# Patient Record
Sex: Female | Born: 1960 | Race: Black or African American | Hispanic: No | Marital: Single | Smoking: Never smoker
Health system: Southern US, Community
[De-identification: ages and names within clinical notes are randomized; demographics above are authoritative.]

## PROBLEM LIST (undated history)

## (undated) DIAGNOSIS — G629 Polyneuropathy, unspecified: Secondary | ICD-10-CM

## (undated) DIAGNOSIS — I10 Essential (primary) hypertension: Secondary | ICD-10-CM

## (undated) DIAGNOSIS — M419 Scoliosis, unspecified: Secondary | ICD-10-CM

## (undated) HISTORY — PX: CHOLECYSTECTOMY: SHX55

## (undated) HISTORY — PX: BACK SURGERY: SHX140

---

## 2012-03-25 ENCOUNTER — Emergency Department (HOSPITAL_BASED_OUTPATIENT_CLINIC_OR_DEPARTMENT_OTHER)
Admission: EM | Admit: 2012-03-25 | Discharge: 2012-03-25 | Disposition: A | Discharge status: Home / Self Care | Payer: Medicaid Other | Attending: Emergency Medicine | Admitting: Emergency Medicine

## 2012-03-25 ENCOUNTER — Encounter (HOSPITAL_BASED_OUTPATIENT_CLINIC_OR_DEPARTMENT_OTHER): Payer: Self-pay | Admitting: Emergency Medicine

## 2012-03-25 DIAGNOSIS — H109 Unspecified conjunctivitis: Secondary | ICD-10-CM | POA: Insufficient documentation

## 2012-03-25 DIAGNOSIS — G609 Hereditary and idiopathic neuropathy, unspecified: Secondary | ICD-10-CM | POA: Insufficient documentation

## 2012-03-25 DIAGNOSIS — I10 Essential (primary) hypertension: Secondary | ICD-10-CM | POA: Insufficient documentation

## 2012-03-25 HISTORY — DX: Scoliosis, unspecified: M41.9

## 2012-03-25 HISTORY — DX: Essential (primary) hypertension: I10

## 2012-03-25 HISTORY — DX: Polyneuropathy, unspecified: G62.9

## 2012-03-25 MED ORDER — BACITRA-NEOMYCIN-POLYMYXIN-HC 1 % OP OINT: TOPICAL_OINTMENT | Freq: Three times a day (TID) | OPHTHALMIC | Status: AC

## 2012-03-25 NOTE — ED Notes (Signed)
Eye burning and stinging started 4 days ago.

## 2012-03-25 NOTE — ED Provider Notes (Signed)
History     CSN: [id number]  Arrival date & time 03/25/12  2124   First MD Initiated Contact with Patient 03/25/12 2158      Chief Complaint  Patient presents with  . Eye Pain    (Consider location/radiation/quality/duration/timing/severity/associated sxs/prior treatment) HPI Comments: No injury or exposures.  Had similar complaints that were treated with drops several months ago.  Not a contact lens wearer.  Patient is a 51 y.o. female presenting with eye pain. The history is provided by the patient.  Eye Pain This is a new problem. Episode onset: 4 days ago. The problem occurs constantly. The problem has been gradually worsening. The symptoms are aggravated by nothing. The symptoms are relieved by nothing. She has tried nothing for the symptoms.    Past Medical History  Diagnosis Date  . Hypertension   . Peripheral neuropathy   . Scoliosis     Past Surgical History  Procedure Date  . Back surgery   . Cholecystectomy     No family history on file.  History  Substance Use Topics  . Smoking status: Never Smoker   . Smokeless tobacco: Never Used  . Alcohol Use: 21.0 oz/week    15 Shots of liquor, 20 Cans of beer per week    OB History    Grav Para Term Preterm Abortions TAB SAB Ect Mult Living                  Review of Systems  Eyes: Positive for pain.  All other systems reviewed and are negative.    Allergies  Review of patient's allergies indicates no known allergies.  Home Medications  No current outpatient prescriptions on file.  BP 136/100  Pulse 79  Temp(Src) 97.5 F (36.4 C) (Oral)  Resp 16  Ht 5\' 6"  (1.676 m)  Wt 135 lb (61.236 kg)  BMI 21.79 kg/m2  SpO2 100%  LMP 03/25/2008  Physical Exam  Nursing note and vitals reviewed. Constitutional: She is oriented to person, place, and time. She appears well-developed and well-nourished.  HENT:  Head: Normocephalic and atraumatic.  Eyes:       The eyes are noted to be essentially normal in  appearance.  There is mild conjunctival injection present.  The corneas are clear and pupils are reactive.  Neck: Normal range of motion. Neck supple.  Neurological: She is alert and oriented to person, place, and time.    ED Course  Procedures (including critical care time)  Labs Reviewed - No data to display No results found.   No diagnosis found.    MDM  Appears to be conjunctivitis.  Will treat with polymixin drops, return prn.        Geoffery Lyons, MD 03/25/12 [phone]

## 2012-03-25 NOTE — Discharge Instructions (Signed)
Conjunctivitis Conjunctivitis is commonly called "pink eye." Conjunctivitis can be caused by bacterial or viral infection, allergies, or injuries. There is usually redness of the lining of the eye, itching, discomfort, and sometimes discharge. There may be deposits of matter along the eyelids. A viral infection usually causes a watery discharge, while a bacterial infection causes a yellowish, thick discharge. Pink eye is very contagious and spreads by direct contact. You may be given antibiotic eyedrops as part of your treatment. Before using your eye medicine, remove all drainage from the eye by washing gently with warm water and cotton balls. Continue to use the medication until you have awakened 2 mornings in a row without discharge from the eye. Do not rub your eye. This increases the irritation and helps spread infection. Use separate towels from other household members. Wash your hands with soap and water before and after touching your eyes. Use cold compresses to reduce pain and sunglasses to relieve irritation from light. Do not wear contact lenses or wear eye makeup until the infection is gone. SEEK MEDICAL CARE IF:   Your symptoms are not better after 3 days of treatment.   You have increased pain or trouble seeing.   The outer eyelids become very red or swollen.  Document Released: 11/19/2004 Document Revised: 10/01/2011 Document Reviewed: 10/12/2005 Exit[employer] Patient Information 2012 [employer]. 

## 2012-10-01 ENCOUNTER — Emergency Department (HOSPITAL_BASED_OUTPATIENT_CLINIC_OR_DEPARTMENT_OTHER)
Admission: EM | Admit: 2012-10-01 | Discharge: 2012-10-01 | Disposition: A | Discharge status: Home / Self Care | Payer: Medicaid Other | Attending: Emergency Medicine | Admitting: Emergency Medicine

## 2012-10-01 ENCOUNTER — Encounter (HOSPITAL_BASED_OUTPATIENT_CLINIC_OR_DEPARTMENT_OTHER): Payer: Self-pay | Admitting: *Deleted

## 2012-10-01 ENCOUNTER — Emergency Department (HOSPITAL_BASED_OUTPATIENT_CLINIC_OR_DEPARTMENT_OTHER): Payer: Medicaid Other

## 2012-10-01 DIAGNOSIS — M1710 Unilateral primary osteoarthritis, unspecified knee: Secondary | ICD-10-CM | POA: Insufficient documentation

## 2012-10-01 DIAGNOSIS — M4120 Other idiopathic scoliosis, site unspecified: Secondary | ICD-10-CM | POA: Insufficient documentation

## 2012-10-01 DIAGNOSIS — M25469 Effusion, unspecified knee: Secondary | ICD-10-CM | POA: Insufficient documentation

## 2012-10-01 DIAGNOSIS — G609 Hereditary and idiopathic neuropathy, unspecified: Secondary | ICD-10-CM | POA: Insufficient documentation

## 2012-10-01 DIAGNOSIS — M1712 Unilateral primary osteoarthritis, left knee: Secondary | ICD-10-CM

## 2012-10-01 DIAGNOSIS — R269 Unspecified abnormalities of gait and mobility: Secondary | ICD-10-CM | POA: Insufficient documentation

## 2012-10-01 DIAGNOSIS — I10 Essential (primary) hypertension: Secondary | ICD-10-CM | POA: Insufficient documentation

## 2012-10-01 DIAGNOSIS — IMO0002 Reserved for concepts with insufficient information to code with codable children: Secondary | ICD-10-CM | POA: Insufficient documentation

## 2012-10-01 DIAGNOSIS — R609 Edema, unspecified: Secondary | ICD-10-CM | POA: Insufficient documentation

## 2012-10-01 DIAGNOSIS — Z8739 Personal history of other diseases of the musculoskeletal system and connective tissue: Secondary | ICD-10-CM | POA: Insufficient documentation

## 2012-10-01 MED ORDER — HYDROCODONE-ACETAMINOPHEN 5-325 MG PO TABS: 2.0000 | ORAL_TABLET | Freq: Four times a day (QID) | ORAL | Status: DC | PRN

## 2012-10-01 NOTE — ED Provider Notes (Addendum)
History   This chart was scribed for Doug Sou, MD by Leone Payor, ED Scribe. This patient was seen in room MH10/MH10 and the patient's care was started at 1731.   CSN: [id number]  Arrival date & time 10/01/12  1639   First MD Initiated Contact with Patient 10/01/12 1731      Chief Complaint  Patient presents with  . Knee Pain     The history is provided by the patient. No language interpreter was used.     Alice Riley is a 51 y.o. female who presents to the Emergency Department complaining of constant, gradually worsening left knee pain  1 week ago. She denies any known injury to the area and describes the pain as aching "like a bad tooth-ache". Pt reports taking Advil with no relief. She denies having prior pain in the left knee and denies back pain or neck pain. No fever no other joint ache no injury pain is worse with movement improved with remaining still. Nonradiating  Pt denies smoking and alcohol use.   History reviewed. No pertinent past medical history.  History reviewed. No pertinent past surgical history.  History reviewed. No pertinent family history.  History  Substance Use Topics  . Smoking status: Never Smoker   . Smokeless tobacco: Not on file  . Alcohol Use: Yes    No OB history provided.   Review of Systems  Constitutional: Negative.   HENT: Negative.  Negative for neck pain.   Respiratory: Negative.   Cardiovascular: Negative.   Gastrointestinal: Negative.   Musculoskeletal: Positive for arthralgias (left knee). Negative for back pain.       Left knee pain otherwise no arthralgias  Skin: Negative.   Neurological: Negative.   Hematological: Negative.   Psychiatric/Behavioral: Negative.   All other systems reviewed and are negative.    Allergies  Review of patient's allergies indicates no known allergies.  Home Medications  No current outpatient prescriptions on file.  BP 154/109  Pulse 90  Temp 99 F (37.2 C) (Oral)  Resp 20   Ht 5\' 6"  (1.676 m)  Wt 135 lb (61.236 kg)  BMI 21.79 kg/m2  SpO2 99%  LMP 10/01/2009  Physical Exam  Nursing note and vitals reviewed. Constitutional: She appears well-developed and well-nourished.  HENT:  Head: Normocephalic and atraumatic.  Eyes: Conjunctivae normal are normal. Pupils are equal, round, and reactive to light.  Neck: Neck supple. No tracheal deviation present. No thyromegaly present.  Cardiovascular: Normal rate, regular rhythm and normal heart sounds.   No murmur heard. Pulmonary/Chest: Effort normal and breath sounds normal.  Abdominal: Soft. Bowel sounds are normal. She exhibits no distension. There is no tenderness.  Musculoskeletal: Normal range of motion. She exhibits no edema and no tenderness.       Left knee is mildly swollen and tender anteriorly, not hot or red. Hip is normal. Ankle and foot are normal  Neurological: She is alert. Coordination normal.       Walks with a limp favoring left leg  Skin: Skin is warm and dry. No rash noted.  Psychiatric: She has a normal mood and affect.    ED Course  Procedures (including critical care time)  DIAGNOSTIC STUDIES: Oxygen Saturation is 99% on room air, normal by my interpretation.    COORDINATION OF CARE:     Labs Reviewed - No data to display Dg Knee Complete 4 Views Left  10/01/2012  *RADIOLOGY REPORT*  Clinical Data: Knee pain for 1 week  LEFT KNEE -  COMPLETE 4+ VIEW  Comparison: None.  Findings: Four views of the left knee submitted.  No acute fracture or subluxation.  Mild narrowing of medial joint compartment.  Small joint effusion.  Mild spurring of the medial femoral condyle.  IMPRESSION: No acute fracture or subluxation.  Small joint effusion.  Mild degenerative changes.   Original Report Authenticated By: Natasha Mead, M.D.    x-ray reviewed by me   No diagnosis found.    MDM  Knee is minimally swollen. Patient likely with arthritis likely degenerative in etiology. Plan prescription Vicodin,  crutches, orthopedic follow up if not improved 2 days. Blood pressure recheck within 3 weeks. Note narcotic pain medicine not given here as patient is driving home. Diagnosis #1 arthritis right knee #2 elevated blood pressure       I personally performed the services described in this documentation, which was scribed in my presence. The recorded information has been reviewed and is accurate.    Doug Sou, MD 10/01/12 1811  Doug Sou, MD 10/01/12 1816

## 2012-10-01 NOTE — ED Notes (Signed)
Pt states that her left knee has been hurting for about a week. No known injury.

## 2012-10-03 ENCOUNTER — Encounter (HOSPITAL_BASED_OUTPATIENT_CLINIC_OR_DEPARTMENT_OTHER): Payer: Self-pay | Admitting: Emergency Medicine

## 2013-08-14 ENCOUNTER — Emergency Department (HOSPITAL_BASED_OUTPATIENT_CLINIC_OR_DEPARTMENT_OTHER)
Admission: EM | Admit: 2013-08-14 | Discharge: 2013-08-14 | Disposition: A | Discharge status: Home / Self Care | Payer: Medicaid Other | Attending: Emergency Medicine | Admitting: Emergency Medicine

## 2013-08-14 ENCOUNTER — Encounter (HOSPITAL_BASED_OUTPATIENT_CLINIC_OR_DEPARTMENT_OTHER): Payer: Self-pay | Admitting: Emergency Medicine

## 2013-08-14 DIAGNOSIS — Z8669 Personal history of other diseases of the nervous system and sense organs: Secondary | ICD-10-CM | POA: Insufficient documentation

## 2013-08-14 DIAGNOSIS — I10 Essential (primary) hypertension: Secondary | ICD-10-CM | POA: Insufficient documentation

## 2013-08-14 DIAGNOSIS — R112 Nausea with vomiting, unspecified: Secondary | ICD-10-CM | POA: Insufficient documentation

## 2013-08-14 DIAGNOSIS — Z8739 Personal history of other diseases of the musculoskeletal system and connective tissue: Secondary | ICD-10-CM | POA: Insufficient documentation

## 2013-08-14 DIAGNOSIS — N390 Urinary tract infection, site not specified: Secondary | ICD-10-CM | POA: Insufficient documentation

## 2013-08-14 DIAGNOSIS — R42 Dizziness and giddiness: Secondary | ICD-10-CM | POA: Insufficient documentation

## 2013-08-14 DIAGNOSIS — R5381 Other malaise: Secondary | ICD-10-CM | POA: Insufficient documentation

## 2013-08-14 DIAGNOSIS — M79609 Pain in unspecified limb: Secondary | ICD-10-CM | POA: Insufficient documentation

## 2013-08-14 LAB — BASIC METABOLIC PANEL
BUN: 4 mg/dL — ABNORMAL LOW (ref 6–23)
Calcium: 10.1 mg/dL (ref 8.4–10.5)
GFR calc Af Amer: >90 mL/min (ref >90)
GFR calc non Af Amer: >90 mL/min (ref >90)
Glucose, Bld: 104 mg/dL — ABNORMAL HIGH (ref 70–99)
Sodium: 131 mEq/L — ABNORMAL LOW (ref 135–145)

## 2013-08-14 LAB — URINALYSIS, ROUTINE W REFLEX MICROSCOPIC
Nitrite: POSITIVE — AB
Protein, ur: 100 mg/dL — AB
Specific Gravity, Urine: 1.017 (ref 1.005–1.030)
Urobilinogen, UA: 1 mg/dL (ref 0.0–1.0)

## 2013-08-14 LAB — URINE MICROSCOPIC-ADD ON

## 2013-08-14 MED ORDER — SODIUM CHLORIDE 0.9 % IV BOLUS (SEPSIS)
1000.0000 mL | Freq: Once | INTRAVENOUS | Status: AC
  Administered 2013-08-14: 1000 mL via INTRAVENOUS

## 2013-08-14 MED ORDER — FAMOTIDINE IN NACL 20-0.9 MG/50ML-% IV SOLN
20.0000 mg | Freq: Once | INTRAVENOUS | Status: AC
  Administered 2013-08-14: 20 mg via INTRAVENOUS
  Filled 2013-08-14: qty 50

## 2013-08-14 MED ORDER — HYDROCODONE-ACETAMINOPHEN 5-325 MG PO TABS: 1.0000 | ORAL_TABLET | ORAL | Status: DC | PRN

## 2013-08-14 MED ORDER — CEFTRIAXONE SODIUM 1 G IJ SOLR
1.0000 g | Freq: Once | INTRAMUSCULAR | Status: AC
  Administered 2013-08-14: 1 g via INTRAMUSCULAR
  Filled 2013-08-14: qty 10

## 2013-08-14 MED ORDER — LIDOCAINE HCL (PF) 1 % IJ SOLN
INTRAMUSCULAR | Status: AC
  Administered 2013-08-14: 5 mL
  Filled 2013-08-14: qty 5

## 2013-08-14 MED ORDER — ONDANSETRON 8 MG PO TBDP
8.0000 mg | ORAL_TABLET | Freq: Once | ORAL | Status: AC
  Administered 2013-08-14: 8 mg via ORAL
  Filled 2013-08-14: qty 1

## 2013-08-14 MED ORDER — DIPHENHYDRAMINE HCL 50 MG/ML IJ SOLN
25.0000 mg | Freq: Once | INTRAMUSCULAR | Status: AC
  Administered 2013-08-14: 25 mg via INTRAVENOUS
  Filled 2013-08-14: qty 1

## 2013-08-14 MED ORDER — KETOROLAC TROMETHAMINE 30 MG/ML IJ SOLN
30.0000 mg | Freq: Once | INTRAMUSCULAR | Status: AC
  Administered 2013-08-14: 30 mg via INTRAVENOUS
  Filled 2013-08-14: qty 1

## 2013-08-14 MED ORDER — ONDANSETRON HCL 4 MG PO TABS: 4.0000 mg | ORAL_TABLET | Freq: Three times a day (TID) | ORAL | Status: DC | PRN

## 2013-08-14 MED ORDER — CEPHALEXIN 500 MG PO CAPS: 500.0000 mg | ORAL_CAPSULE | Freq: Four times a day (QID) | ORAL | Status: DC

## 2013-08-14 NOTE — ED Notes (Signed)
Pt reports she awakened with left LE pain, dizziness, generalized weakness and a headache this am. Pt appears anxious.

## 2013-08-14 NOTE — ED Provider Notes (Signed)
CSN: [id number]     Arrival date & time 08/14/13  4098 History   First MD Initiated Contact with Patient 08/14/13 1019     Chief Complaint  Patient presents with  . Oral Swelling  . Dizziness  . Leg Pain  . Weakness   (Consider location/radiation/quality/duration/timing/severity/associated sxs/prior Treatment) The history is provided by the patient.    Pt reports she was feeling fatigued yesterday and then overnight developed N/V x 3, emesis was yellow, and headache.  Headache is over the top of her head and her bilateral temples, the pain began as mild and gradually worsened.   She is also having occasional cramping in her bilateral lower extremities that last seconds. Also notes lower lip swelling with itching. Denies itching or swelling within mouth or throat. Denies neck stiffness, CP, SOB, cough, abdominal pain, diarrhea, urinary symptoms, abnormal vaginal discharge or bleeding.   Denies sick contacts, recent travel, new or different foods.  Denies any contact with allergens.  Denies taking any blood pressure medications.   Past Medical History  Diagnosis Date  . Hypertension   . Peripheral neuropathy   . Scoliosis    Past Surgical History  Procedure Laterality Date  . Back surgery    . Cholecystectomy     No family history on file. History  Substance Use Topics  . Smoking status: Never Smoker   . Smokeless tobacco: Not on file  . Alcohol Use: 0.0 oz/week     Comment: last drink 1 week ago   OB History   Grav Para Term Preterm Abortions TAB SAB Ect Mult Living                 Review of Systems  Constitutional: Negative for fever and chills.  Respiratory: Negative for cough and shortness of breath.   Cardiovascular: Negative for chest pain.  Gastrointestinal: Negative for nausea, vomiting, abdominal pain and diarrhea.  Genitourinary: Negative for dysuria, urgency, frequency, vaginal bleeding and vaginal discharge.  Neurological: Positive for headaches.     Allergies  Review of patient's allergies indicates no known allergies.  Home Medications  No current outpatient prescriptions on file. BP 155/96  Pulse 97  Temp(Src) 98.8 F (37.1 C) (Oral)  Resp 20  SpO2 100% Physical Exam  Nursing note and vitals reviewed. Constitutional: She appears well-developed and well-nourished. No distress.  HENT:  Head: Normocephalic and atraumatic.  Neck: Neck supple.  Cardiovascular: Normal rate and regular rhythm.   Pulmonary/Chest: Effort normal and breath sounds normal. No respiratory distress. She has no wheezes. She has no rales.  Abdominal: Soft. She exhibits no distension. There is tenderness. There is no rebound, no guarding and no CVA tenderness.  Very mild lower abdominal tenderness.  Neurological: She is alert. She has normal strength. No cranial nerve deficit or sensory deficit. She exhibits normal muscle tone. Coordination and gait normal. GCS eye subscore is 4. GCS verbal subscore is 5. GCS motor subscore is 6.  Gait is slow and pt appears weak, but neurologically normal.   Skin: She is not diaphoretic.    ED Course  Procedures (including critical care time) Labs Review Labs Reviewed  URINALYSIS, ROUTINE W REFLEX MICROSCOPIC - Abnormal; Notable for the following:    APPearance CLOUDY (*)    Hgb urine dipstick TRACE (*)    Ketones, ur >80 (*)    Protein, ur 100 (*)    Nitrite POSITIVE (*)    Leukocytes, UA SMALL (*)    All other components within normal limits  URINE MICROSCOPIC-ADD ON - Abnormal; Notable for the following:    Bacteria, UA MANY (*)    All other components within normal limits  BASIC METABOLIC PANEL - Abnormal; Notable for the following:    Sodium 131 (*)    Chloride 89 (*)    Glucose, Bld 104 (*)    BUN 4 (*)    All other components within normal limits  URINE CULTURE   Imaging Review No results found.  EKG Interpretation   None       1:49 PM Pt states she is feeling much better.  Headache has  improved, lower lip swelling has improved. Abd reexamined and it is unchanged.  No notable tenderness physically but pt states it is mildly sore when palpated.   MDM   1. UTI (lower urinary tract infection)   2. N&V (nausea and vomiting)    Pt with fatigue followed by N/V x 3 and mild lower abdominal discomfort.  N/V resolved, no episodes in the ED.  Mildly dehydrated per BMP, given IVF hydration.  Pt also given 1g Rocephin for UTI.  No CVA tenderness, pt is afebrile, doubt pyelonephritis.  Pt with only very mild abdominal discomfort and abdominal exam is benign, repeat abdominal exam also benign.  Pt with headache, improved with toradol.  Neurological exam is normal.  No meningeal signs. Pt advised to rest and continue PO hydration. Lower lip swelling was very mild, improved with time vs benadryl/pepcid.  No mouth or throat swelling, itching, difficulty swallowing or breathing.  Unclear etiology of this swelling, may have been simple irritation from emesis.  Without of symptoms of allergic reaction or reason for allergic, I did not believe prednisone was indicated. Lower extremity pain was in bilateral lower extremities and was intermittent, lasting only seconds.  No peripheral edema or tenderness.  Likely muscle spasms, possible body aches from early illness. Doubt DVT. VSS. Feeling better after medications.  D/C home with keflex, norco, zofran.   Discussed strict return precautions with patient and patient's daughter who verbalize understanding.   Discussed result, findings, treatment, and follow up  with patient.  Pt given return precautions.  Pt verbalizes understanding and agrees with plan.          Trixie Dredge, PA-C 08/14/13 1537

## 2013-08-15 NOTE — ED Provider Notes (Signed)
Medical screening examination/treatment/procedure(s) were performed by non-physician practitioner and as supervising physician I was immediately available for consultation/collaboration.   Charles B. Sheldon, MD 08/15/13 0743 

## 2013-08-16 LAB — URINE CULTURE: Colony Count: >=100000

## 2016-06-02 DIAGNOSIS — I10 Essential (primary) hypertension: Secondary | ICD-10-CM | POA: Diagnosis present

## 2017-06-16 DIAGNOSIS — D509 Iron deficiency anemia, unspecified: Secondary | ICD-10-CM | POA: Insufficient documentation

## 2019-01-04 ENCOUNTER — Emergency Department (HOSPITAL_BASED_OUTPATIENT_CLINIC_OR_DEPARTMENT_OTHER)
Admission: EM | Admit: 2019-01-04 | Discharge: 2019-01-04 | Disposition: A | Discharge status: Home / Self Care | Payer: Medicare Other | Attending: Emergency Medicine | Admitting: Emergency Medicine

## 2019-01-04 ENCOUNTER — Encounter (HOSPITAL_BASED_OUTPATIENT_CLINIC_OR_DEPARTMENT_OTHER): Payer: Self-pay

## 2019-01-04 ENCOUNTER — Other Ambulatory Visit: Payer: Self-pay

## 2019-01-04 DIAGNOSIS — T162XXA Foreign body in left ear, initial encounter: Secondary | ICD-10-CM

## 2019-01-04 DIAGNOSIS — W458XXA Other foreign body or object entering through skin, initial encounter: Secondary | ICD-10-CM | POA: Insufficient documentation

## 2019-01-04 DIAGNOSIS — Y999 Unspecified external cause status: Secondary | ICD-10-CM | POA: Insufficient documentation

## 2019-01-04 DIAGNOSIS — I10 Essential (primary) hypertension: Secondary | ICD-10-CM | POA: Diagnosis not present

## 2019-01-04 DIAGNOSIS — Y939 Activity, unspecified: Secondary | ICD-10-CM | POA: Insufficient documentation

## 2019-01-04 DIAGNOSIS — Z79899 Other long term (current) drug therapy: Secondary | ICD-10-CM | POA: Insufficient documentation

## 2019-01-04 DIAGNOSIS — Y929 Unspecified place or not applicable: Secondary | ICD-10-CM | POA: Diagnosis not present

## 2019-01-04 DIAGNOSIS — S00451A Superficial foreign body of right ear, initial encounter: Secondary | ICD-10-CM | POA: Insufficient documentation

## 2019-01-04 MED ORDER — DOXYCYCLINE HYCLATE 100 MG PO CAPS: 100.0000 mg | ORAL_CAPSULE | Freq: Two times a day (BID) | ORAL | 0 refills | Status: DC

## 2019-01-04 NOTE — Discharge Instructions (Addendum)
You have been seen today for foreign body in ear. Please read and follow all provided instructions.   1. Medications: doxycyline (antibiotic), usual home medications 2. Treatment: rest, drink plenty of fluids 3. Follow Up: Please follow up with your primary doctor in 2 days for discussion of your diagnoses and further evaluation after today's visit; if you do not have a primary care doctor use the resource guide provided to find one; Please return to the ER for any new or worsening symptoms. Please obtain all of your results from medical records or have your doctors office obtain the results - share them with your doctor - you should be seen at your doctors office. Call today to arrange your follow up.   Take medications as prescribed. Please review all of the medicines and only take them if you do not have an allergy to them. Return to the emergency room for worsening condition or new concerning symptoms. Follow up with your regular doctor. If you don't have a regular doctor use one of the numbers below to establish a primary care doctor.  Please be aware that if you are taking birth control pills, taking other prescriptions, ESPECIALLY ANTIBIOTICS may make the birth control ineffective - if this is the case, either do not engage in sexual activity or use alternative methods of birth control such as condoms until you have finished the medicine and your family doctor says it is OK to restart them. If you are on a blood thinner such as COUMADIN, be aware that any other medicine that you take may cause the coumadin to either work too much, or not enough - you should have your coumadin level rechecked in next 7 days if this is the case.  ?  It is also a possibility that you have an allergic reaction to any of the medicines that you have been prescribed - Everybody reacts differently to medications and while MOST people have no trouble with most medicines, you may have a reaction such as nausea, vomiting,  rash, swelling, shortness of breath. If this is the case, please stop taking the medicine immediately and contact your physician.  ?  You should return to the ER if you develop severe or worsening symptoms.   Emergency Department Resource Guide 1) Find a Doctor and Pay Out of Pocket Although you won't have to find out who is covered by your insurance plan, it is a good idea to ask around and get recommendations. You will then need to call the office and see if the doctor you have chosen will accept you as a new patient and what types of options they offer for patients who are self-pay. Some doctors offer discounts or will set up payment plans for their patients who do not have insurance, but you will need to ask so you aren't surprised when you get to your appointment.  2) Contact Your Local Health Department Not all health departments have doctors that can see patients for sick visits, but many do, so it is worth a call to see if yours does. If you don't know where your local health department is, you can check in your phone book. The CDC also has a tool to help you locate your state's health department, and many state websites also have listings of all of their local health departments.  3) Find a Walk-in Clinic If your illness is not likely to be very severe or complicated, you may want to try a walk in clinic. These are  popping up all over the country in pharmacies, drugstores, and shopping centers. They're usually staffed by nurse practitioners or physician assistants that have been trained to treat common illnesses and complaints. They're usually fairly quick and inexpensive. However, if you have serious medical issues or chronic medical problems, these are probably not your best option.  No Primary Care Doctor: Call Health Connect at  [phone] - they can help you locate a primary care doctor that  accepts your insurance, provides certain services, etc. Physician Referral [phone]  Emergency Department Resource Guide 1) Find a Doctor and Pay Out of Pocket Although you won't have to find out who is covered by your insurance plan, it is a good idea to ask around and get recommendations. You will then need to call the office and see if the doctor you have chosen will accept you as a new patient and what types of options they offer for patients who are self-pay. Some doctors offer discounts or will set up payment plans for their patients who do not have insurance, but you will need to ask so you aren't surprised when you get to your appointment.  2) Contact Your Local Health Department Not all health departments have doctors that can see patients for sick visits, but many do, so it is worth a call to see if yours does. If you don't know where your local health department is, you can check in your phone book. The CDC also has a tool to help you locate your state's health department, and many state websites also have listings of all of their local health departments.  3) Find a [facility] If your illness is not likely to be very severe or complicated, you may want to try a walk in clinic. These are popping up all over the country in pharmacies, drugstores, and shopping centers. They're usually staffed by nurse practitioners or physician assistants that have been trained to treat common illnesses and complaints. They're usually fairly quick and inexpensive. However, if you have serious medical issues or chronic medical problems, these are probably not your best option.  No Primary Care Doctor: Call Health Connect at  [phone] - they can help you locate a primary care doctor that  accepts your insurance, provides certain services, etc. Physician Referral Service- [phone]  Chronic Pain Problems: Organization         Address  Phone   Notes  [facility]  [phone] Patients need to be referred by their primary care doctor.    Medication Assistance: Organization         Address  Phone   Notes  [facility] Medication [facility] [address] [place], [place] [address] [phone] --Must be a resident of [facility] -- Must have NO insurance coverage whatsoever (no Medicaid/ Medicare, etc.) -- The pt. MUST have a primary care doctor that directs their care regularly and follows them in the community   [facility]  [phone]   [employer]  [phone]    Agencies that provide inexpensive medical care: Organization         Address  Phone   Notes  [place]  [phone]   [facility]    [phone]   [facility], [place] [address] [phone]   [place] [address] [place] [phone]   [facility]    [phone])  [facility]    [phone]   [address], [place] Phone:  [phone], Fax:  [phone] Hours of Operation:  9 am - 6 pm, M-F.  Also accepts Medicaid/Medicare and self-pay.  [facility] for [place] [address], [address], [place] Phone: [phone], Fax: [phone]. Hours of Operation:  8:30 am - 5:30 pm, M-F.  Also accepts Medicaid and self-pay.  [facility] [address] [place] Phone: [phone]   [place], [place], [place] [phone], Ext. [phone] Mondays & Thursdays: 7-9 AM.  First 15 patients are seen on a first come, first serve basis.    [place] Providers:  Organization         Address  Phone   Notes  [facility] [address], [place] [phone] Also accepts self-pay patients.  [facility] [address], [place]  [phone]   [place], [address], [place] [phone]   [facility] [address], [place] [phone]   Lucianne Lei [address], [place]   [phone] Only accepts [place] [facility] [place] patients after they have their name applied to their card.   Self-Pay (no insurance) in [facility]:  Organization         Address  Phone   Notes  Sickle Cell Patients, [facility] [place] [phone]   [facility] [place] [phone]   [facility] [place]  [place], [place], [place] [phone]   [facility]/Dr. Osei-Bonsu  [address], [place] or [address], [place] [phone] Phone number for both [place] and [place] locations is the same.  Urgent [facility] and [facility] [address] [place] [phone]   [facility] [address] [place] or [address] [phone]   [facility] [address], [place] [phone], phone; [phone], fax Sees patients 1st and 3rd Saturday of every month.  Must not qualify for public or private insurance (i.e. Medicaid, Medicare, [place] [employer], Veterans' Benefits)  Household income should be no more than 200% of the poverty level The clinic cannot treat you if you are pregnant or think you are pregnant  Sexually transmitted diseases are not treated at the clinic.

## 2019-01-04 NOTE — ED Notes (Signed)
Pt denies pain except when ear is touched.

## 2019-01-04 NOTE — ED Triage Notes (Signed)
Pt states she may have a small earring in left ear-first noticed earring missing today-has been having pain when she cleans ear-NAD-steady gait

## 2019-01-04 NOTE — ED Provider Notes (Signed)
[facility] EMERGENCY DEPARTMENT Provider Note   CSN: [id number] Arrival date & time: 01/04/19  1256    History   Chief Complaint Chief Complaint  Patient presents with  . Foreign Body in Ear    HPI Alice Riley is a 58 y.o. female with a PMH of HTN and peripheral neuropathy presenting with left ear pain onset 1 week ago. Patient describes pain as a burning sensation that is worse with palpation. Patient reports intermittent bleeding due to a small wound on ear lobe. Patient describes pain in left ear lobe due to earring. Patient states her earring is stuck on her earlobe. Patient reports she had similar problems in her right ear, but her daughter was able to remove the other earring. Patient reports cleaning her ear with a cleaning solution, but denies any other treatments. Patient denies fever, chills, nausea, vomiting, or abdominal pain. Patient denies tinnitus, hearing loss, or edema. Patient denies any pain inside her ear. Patient denies cough, congestion, sore throat, or rhinorrhea.      HPI  Past Medical History:  Diagnosis Date  . Hypertension   . Peripheral neuropathy   . Scoliosis     There are no active problems to display for this patient.   Past Surgical History:  Procedure Laterality Date  . BACK SURGERY    . CHOLECYSTECTOMY       OB History   No obstetric history on file.      Home Medications    Prior to Admission medications   Medication Sig Start Date End Date Taking? Authorizing Provider  atorvastatin (LIPITOR) 80 MG tablet Take by mouth. 09/26/18  Yes [provider]  hydrochlorothiazide (HYDRODIURIL) 25 MG tablet Take by mouth. 06/16/17  Yes [provider]  cephALEXin (KEFLEX) 500 MG capsule Take 1 capsule (500 mg total) by mouth 4 (four) times daily. 08/14/13   Trixie Dredge, PA-C  doxycycline (VIBRAMYCIN) 100 MG capsule Take 1 capsule (100 mg total) by mouth 2 (two) times daily. 01/04/19   Carlyle Basques P, PA-C   HYDROcodone-acetaminophen (NORCO/VICODIN) 5-325 MG per tablet Take 1 tablet by mouth every 4 (four) hours as needed for pain. 08/14/13   Trixie Dredge, PA-C  lisinopril (PRINIVIL,ZESTRIL) 20 MG tablet Take 20 mg by mouth daily. 12/18/18   [provider]  ondansetron (ZOFRAN) 4 MG tablet Take 1 tablet (4 mg total) by mouth every 8 (eight) hours as needed for nausea. 08/14/13   Trixie Dredge, PA-C  pregabalin (LYRICA) 100 MG capsule Take 100 mg by mouth 2 (two) times daily. 11/11/18   [provider]    Family History No family history on file.  Social History Social History   Tobacco Use  . Smoking status: Never Smoker  . Smokeless tobacco: Never Used  Substance Use Topics  . Alcohol use: Yes    Comment: occ  . Drug use: No     Allergies   Patient has no known allergies.   Review of Systems Review of Systems  Constitutional: Negative for chills, diaphoresis and fever.  HENT: Negative for congestion, facial swelling, rhinorrhea, sore throat, trouble swallowing and voice change.   Respiratory: Negative for shortness of breath.   Gastrointestinal: Negative for abdominal pain, nausea and vomiting.  Endocrine: Negative for cold intolerance and heat intolerance.  Musculoskeletal: Negative for neck pain.  Skin: Positive for wound. Negative for color change.       Pt reports a foreign body in ear lobe.   Allergic/Immunologic: Negative for immunocompromised state.  Neurological: Negative for dizziness.  Hematological: Negative for adenopathy.     Physical Exam Updated Vital Signs BP 120/89 (BP Location: Right Arm)   Pulse 90   Temp 98.4 F (36.9 C) (Oral)   Resp 14   Ht 5\' 6"  (1.676 m)   Wt 67.6 kg   LMP 10/01/2009   SpO2 99%   BMI 24.05 kg/m   Physical Exam Vitals signs and nursing note reviewed.  Constitutional:      General: She is not in acute distress.    Appearance: She is well-developed. She is not diaphoretic.  HENT:     Head: Normocephalic  and atraumatic.     Right Ear: Hearing, ear canal and external ear normal. There is impacted cerumen.     Left Ear: Hearing, tympanic membrane and ear canal normal.     Ears:      Nose: Nose normal. No congestion or rhinorrhea.     Mouth/Throat:     Mouth: Mucous membranes are moist.     Pharynx: No posterior oropharyngeal erythema.  Eyes:     Extraocular Movements: Extraocular movements intact.     Conjunctiva/sclera: Conjunctivae normal.     Pupils: Pupils are equal, round, and reactive to light.  Neck:     Musculoskeletal: Normal range of motion and neck supple.  Cardiovascular:     Rate and Rhythm: Normal rate and regular rhythm.     Heart sounds: Normal heart sounds. No murmur. No friction rub. No gallop.   Pulmonary:     Effort: Pulmonary effort is normal. No respiratory distress.     Breath sounds: Normal breath sounds. No wheezing or rales.  Abdominal:     Palpations: Abdomen is soft.     Tenderness: There is no abdominal tenderness.  Musculoskeletal: Normal range of motion.  Skin:    Findings: No erythema or rash.  Neurological:     Mental Status: She is alert and oriented to person, place, and time.    ED Treatments / Results  Labs (all labs ordered are listed, but only abnormal results are displayed) Labs Reviewed - No data to display  EKG None  Radiology No results found.  Procedures Procedures (including critical care time)  Medications Ordered in ED Medications - No data to display   Initial Impression / Assessment and Plan / ED Course  I have reviewed the triage vital signs and the nursing notes.  Pertinent labs & imaging results that were available during my care of the patient were reviewed by me and considered in my medical decision making (see chart for details).       Patient presents with a foreign body in left ear lobe. Skin has overgrown over the heart shaped earring and earring is deep within the tissue. Attempted to remove earring with  manipulation without success. Patient is stable in no acute distress. Will start patient on antibiotics and advised patient to follow up with ENT. Will provide referral to ENT. Advised patient to follow up with PCP in 2 days.  Findings and plan of care discussed with supervising physician Dr. Deretha Emory who personally evaluated and examined this patient.  Final Clinical Impressions(s) / ED Diagnoses   Final diagnoses:  Foreign body of left ear, initial encounter    ED Discharge Orders         Ordered    doxycycline (VIBRAMYCIN) 100 MG capsule  2 times daily     01/04/19 1437  Carlyle BasquesHernandez, Kyleen Villatoro LintonP, [place]PA-C 01/04/19 1437    Vanetta MuldersZackowski, Scott, MD 01/05/19 1733

## 2019-12-17 ENCOUNTER — Other Ambulatory Visit (HOSPITAL_COMMUNITY): Payer: Self-pay | Admitting: Physician Assistant

## 2019-12-17 DIAGNOSIS — U071 COVID-19: Secondary | ICD-10-CM

## 2019-12-17 DIAGNOSIS — I10 Essential (primary) hypertension: Secondary | ICD-10-CM

## 2019-12-17 NOTE — Progress Notes (Signed)
  I connected by phone with [name] on 12/17/2019 at 12:43 PM to discuss the potential use of an new treatment for mild to moderate COVID-19 viral infection in non-hospitalized patients.  This patient is a 59 y.o. female that meets the FDA criteria for Emergency Use Authorization of bamlanivimab or casirivimab\imdevimab.  Has a (+) direct SARS-CoV-2 viral test result  Has mild or moderate COVID-19   Is ? 57 years of age and weighs ? 40 kg  Is NOT hospitalized due to COVID-19  Is NOT requiring oxygen therapy or requiring an increase in baseline oxygen flow rate due to COVID-19  Is within 10 days of symptom onset  Has at least one of the high risk factor(s) for progression to severe COVID-19 and/or hospitalization as defined in EUA.  Specific high risk criteria : Hypertension   I have spoken and communicated the following to the patient or parent/caregiver:  1. FDA has authorized the emergency use of bamlanivimab and casirivimab\imdevimab for the treatment of mild to moderate COVID-19 in adults and pediatric patients with positive results of direct SARS-CoV-2 viral testing who are 70 years of age and older weighing at least 40 kg, and who are at high risk for progressing to severe COVID-19 and/or hospitalization.  2. The significant known and potential risks and benefits of bamlanivimab and casirivimab\imdevimab, and the extent to which such potential risks and benefits are unknown.  3. Information on available alternative treatments and the risks and benefits of those alternatives, including clinical trials.  4. Patients treated with bamlanivimab and casirivimab\imdevimab should continue to self-isolate and use infection control measures (e.g., wear mask, isolate, social distance, avoid sharing personal items, clean and disinfect "high touch" surfaces, and frequent handwashing) according to CDC guidelines.   5. The patient or parent/caregiver has the option to accept or refuse  bamlanivimab or casirivimab\imdevimab .  After reviewing this information with the patient, The patient agreed to proceed with receiving the bamlanimivab infusion and will be provided a copy of the Fact sheet prior to receiving the infusion.Sharrell Ku D'Arcy Abraha 12/17/2019 12:43 PM

## 2019-12-19 ENCOUNTER — Ambulatory Visit (HOSPITAL_COMMUNITY): Admission: RE | Admit: 2019-12-19 | Payer: Medicare Other | Source: Ambulatory Visit

## 2020-04-30 DIAGNOSIS — E782 Mixed hyperlipidemia: Secondary | ICD-10-CM | POA: Diagnosis present

## 2020-04-30 DIAGNOSIS — M792 Neuralgia and neuritis, unspecified: Secondary | ICD-10-CM | POA: Diagnosis present

## 2020-08-07 ENCOUNTER — Emergency Department (HOSPITAL_BASED_OUTPATIENT_CLINIC_OR_DEPARTMENT_OTHER)
Admission: EM | Admit: 2020-08-07 | Discharge: 2020-08-08 | Disposition: A | Discharge status: Home / Self Care | Payer: Medicare Other | Attending: Emergency Medicine | Admitting: Emergency Medicine

## 2020-08-07 ENCOUNTER — Encounter (HOSPITAL_BASED_OUTPATIENT_CLINIC_OR_DEPARTMENT_OTHER): Payer: Self-pay | Admitting: *Deleted

## 2020-08-07 ENCOUNTER — Other Ambulatory Visit: Payer: Self-pay

## 2020-08-07 ENCOUNTER — Emergency Department (HOSPITAL_BASED_OUTPATIENT_CLINIC_OR_DEPARTMENT_OTHER): Payer: Medicare Other

## 2020-08-07 DIAGNOSIS — H748X3 Other specified disorders of middle ear and mastoid, bilateral: Secondary | ICD-10-CM | POA: Insufficient documentation

## 2020-08-07 DIAGNOSIS — R55 Syncope and collapse: Secondary | ICD-10-CM | POA: Diagnosis not present

## 2020-08-07 DIAGNOSIS — E860 Dehydration: Secondary | ICD-10-CM

## 2020-08-07 DIAGNOSIS — M545 Low back pain, unspecified: Secondary | ICD-10-CM | POA: Insufficient documentation

## 2020-08-07 DIAGNOSIS — Z20822 Contact with and (suspected) exposure to covid-19: Secondary | ICD-10-CM | POA: Diagnosis not present

## 2020-08-07 DIAGNOSIS — N390 Urinary tract infection, site not specified: Secondary | ICD-10-CM

## 2020-08-07 DIAGNOSIS — Z79899 Other long term (current) drug therapy: Secondary | ICD-10-CM | POA: Diagnosis not present

## 2020-08-07 DIAGNOSIS — N179 Acute kidney failure, unspecified: Secondary | ICD-10-CM

## 2020-08-07 DIAGNOSIS — R42 Dizziness and giddiness: Secondary | ICD-10-CM | POA: Diagnosis not present

## 2020-08-07 DIAGNOSIS — I10 Essential (primary) hypertension: Secondary | ICD-10-CM | POA: Diagnosis not present

## 2020-08-07 LAB — URINALYSIS, ROUTINE W REFLEX MICROSCOPIC
Glucose, UA: NEGATIVE mg/dL
Hgb urine dipstick: NEGATIVE
Ketones, ur: 15 mg/dL — AB
Nitrite: NEGATIVE
Protein, ur: 30 mg/dL — AB
Specific Gravity, Urine: >1.03 — ABNORMAL HIGH (ref 1.005–1.030)
pH: 5.5 (ref 5.0–8.0)

## 2020-08-07 LAB — COMPREHENSIVE METABOLIC PANEL
ALT: 18 U/L (ref 0–44)
AST: 27 U/L (ref 15–41)
Albumin: 4.4 g/dL (ref 3.5–5.0)
Alkaline Phosphatase: 42 U/L (ref 38–126)
Anion gap: 12 (ref 5–15)
BUN: 25 mg/dL — ABNORMAL HIGH (ref 6–20)
CO2: 22 mmol/L (ref 22–32)
Calcium: 8.8 mg/dL — ABNORMAL LOW (ref 8.9–10.3)
Chloride: 102 mmol/L (ref 98–111)
Creatinine, Ser: 2.08 mg/dL — ABNORMAL HIGH (ref 0.44–1.00)
GFR, Estimated: 26 mL/min — ABNORMAL LOW (ref >60)
Glucose, Bld: 133 mg/dL — ABNORMAL HIGH (ref 70–99)
Potassium: 3.1 mmol/L — ABNORMAL LOW (ref 3.5–5.1)
Sodium: 136 mmol/L (ref 135–145)
Total Bilirubin: 0.9 mg/dL (ref 0.3–1.2)
Total Protein: 7.3 g/dL (ref 6.5–8.1)

## 2020-08-07 LAB — CBC WITH DIFFERENTIAL/PLATELET
Abs Immature Granulocytes: 0.06 10*3/uL (ref 0.00–0.07)
Basophils Absolute: 0.1 10*3/uL (ref 0.0–0.1)
Basophils Relative: 1 %
Eosinophils Absolute: 0.1 10*3/uL (ref 0.0–0.5)
Eosinophils Relative: 1 %
HCT: 32.7 % — ABNORMAL LOW (ref 36.0–46.0)
Hemoglobin: 11.5 g/dL — ABNORMAL LOW (ref 12.0–15.0)
Immature Granulocytes: 1 %
Lymphocytes Relative: 25 %
Lymphs Abs: 2.7 10*3/uL (ref 0.7–4.0)
MCH: 31.3 pg (ref 26.0–34.0)
MCHC: 35.2 g/dL (ref 30.0–36.0)
MCV: 89.1 fL (ref 80.0–100.0)
Monocytes Absolute: 0.6 10*3/uL (ref 0.1–1.0)
Monocytes Relative: 6 %
Neutro Abs: 7.2 10*3/uL (ref 1.7–7.7)
Neutrophils Relative %: 66 %
Platelets: 241 10*3/uL (ref 150–400)
RBC: 3.67 MIL/uL — ABNORMAL LOW (ref 3.87–5.11)
RDW: 15.2 % (ref 11.5–15.5)
WBC: 10.7 10*3/uL — ABNORMAL HIGH (ref 4.0–10.5)
nRBC: 0 % (ref 0.0–0.2)

## 2020-08-07 LAB — URINALYSIS, MICROSCOPIC (REFLEX)

## 2020-08-07 LAB — CBG MONITORING, ED: Glucose-Capillary: 125 mg/dL — ABNORMAL HIGH (ref 70–99)

## 2020-08-07 LAB — RESPIRATORY PANEL BY RT PCR (FLU A&B, COVID)
Influenza A by PCR: NEGATIVE
Influenza B by PCR: NEGATIVE
SARS Coronavirus 2 by RT PCR: NEGATIVE

## 2020-08-07 MED ORDER — LACTATED RINGERS IV BOLUS
1000.0000 mL | Freq: Once | INTRAVENOUS | Status: AC
  Administered 2020-08-07: 1000 mL via INTRAVENOUS

## 2020-08-07 MED ORDER — SODIUM CHLORIDE 0.9 % IV SOLN
1.0000 g | Freq: Once | INTRAVENOUS | Status: AC
  Administered 2020-08-08: 1 g via INTRAVENOUS
  Filled 2020-08-07: qty 10

## 2020-08-07 MED ORDER — LACTATED RINGERS IV BOLUS
1000.0000 mL | Freq: Once | INTRAVENOUS | Status: AC
  Administered 2020-08-08: 1000 mL via INTRAVENOUS

## 2020-08-07 NOTE — ED Provider Notes (Signed)
[facility] EMERGENCY DEPARTMENT Provider Note   CSN: [id number] Arrival date & time: 08/07/20  2105     History Chief Complaint  Patient presents with  . Dizziness    Alice Riley is a 59 y.o. female.  The history is provided by the patient.  Dizziness Quality:  Lightheadedness Severity:  Severe Onset quality:  Gradual Duration:  2 days Timing:  Constant Progression:  Worsening Chronicity:  New Context: standing up   Relieved by:  Lying down Worsened by:  Standing up Associated symptoms: tinnitus   Associated symptoms: no chest pain, no headaches, no nausea, no palpitations, no shortness of breath, no vision changes and no vomiting   Associated symptoms comment:  New lower back pain starting yesterday.  No dysuria, fever, cough or chest pain.  No injury or heavy lifting before back pain started and no hx of chronic back pain. Risk factors comment:  Hx of htn, scoliosis and peripheral neuropathy      Past Medical History:  Diagnosis Date  . Hypertension   . Peripheral neuropathy   . Scoliosis     There are no problems to display for this patient.   Past Surgical History:  Procedure Laterality Date  . BACK SURGERY    . CHOLECYSTECTOMY       OB History   No obstetric history on file.     No family history on file.  Social History   Tobacco Use  . Smoking status: Never Smoker  . Smokeless tobacco: Never Used  Vaping Use  . Vaping Use: Never used  Substance Use Topics  . Alcohol use: Yes    Comment: occ  . Drug use: No    Home Medications Prior to Admission medications   Medication Sig Start Date End Date Taking? Authorizing Provider  olopatadine (PATANOL) 0.1 % ophthalmic solution PLACE 1 DROP INTO BOTH EYES 2 (TWO) TIMES DAILY AS NEEDED FOR ALLERGIES. 06/03/20  Yes [provider]  amLODipine (NORVASC) 5 MG tablet Take 5 mg by mouth daily. 04/01/20   [provider]  atorvastatin (LIPITOR) 80 MG tablet Take by mouth.  09/26/18   [provider]  cephALEXin (KEFLEX) 500 MG capsule Take 1 capsule (500 mg total) by mouth 4 (four) times daily. 08/14/13   Trixie Dredge, PA-C  ciprofloxacin-dexamethasone [facility]) OTIC suspension  06/13/20   [provider]  diclofenac Sodium (VOLTAREN) 1 % GEL Apply topically. 07/22/20   [provider]  doxycycline (VIBRAMYCIN) 100 MG capsule Take 1 capsule (100 mg total) by mouth 2 (two) times daily. 01/04/19   Leretha Dykes, PA-C  fenofibrate 54 MG tablet  07/15/20   [provider]  hydrochlorothiazide (HYDRODIURIL) 25 MG tablet Take by mouth. 06/16/17   [provider]  HYDROcodone-acetaminophen (NORCO/VICODIN) 5-325 MG per tablet Take 1 tablet by mouth every 4 (four) hours as needed for pain. 08/14/13   Trixie Dredge, PA-C  lisinopril (PRINIVIL,ZESTRIL) 20 MG tablet Take 20 mg by mouth daily. 12/18/18   [provider]  lisinopril (ZESTRIL) 10 MG tablet Take 10 mg by mouth daily. 05/30/20   [provider]  ondansetron (ZOFRAN) 4 MG tablet Take 1 tablet (4 mg total) by mouth every 8 (eight) hours as needed for nausea. 08/14/13   Trixie Dredge, PA-C  pregabalin (LYRICA) 100 MG capsule Take 100 mg by mouth 2 (two) times daily. 11/11/18   [provider]  valACYclovir (VALTREX) 1000 MG tablet Take 1,000 mg by mouth daily. 05/26/20   [provider]    Allergies    Patient has no known allergies.  Review of Systems   Review of Systems  HENT: Positive for tinnitus.   Respiratory: Negative for shortness of breath.   Cardiovascular: Negative for chest pain and palpitations.  Gastrointestinal: Negative for nausea and vomiting.  Neurological: Positive for dizziness. Negative for headaches.  All other systems reviewed and are negative.   Physical Exam Updated Vital Signs BP 90/65   Pulse 86   Temp 98 F (36.7 C) (Oral)   Resp 14   Ht 5\' 6"  (1.676 m)   Wt 70.8 kg   LMP 10/01/2009   SpO2 100%   BMI  25.18 kg/m   Physical Exam Vitals and nursing note reviewed.  Constitutional:      General: She is not in acute distress.    Appearance: Normal appearance. She is well-developed and normal weight.  HENT:     Head: Normocephalic and atraumatic.     Right Ear: A middle ear effusion is present.     Left Ear: A middle ear effusion is present.     Mouth/Throat:     Mouth: Mucous membranes are moist.  Eyes:     Pupils: Pupils are equal, round, and reactive to light.  Cardiovascular:     Rate and Rhythm: Normal rate and regular rhythm.     Heart sounds: Normal heart sounds. No murmur heard.  No friction rub.  Pulmonary:     Effort: Pulmonary effort is normal.     Breath sounds: Normal breath sounds. No wheezing or rales.  Abdominal:     General: Bowel sounds are normal. There is no distension.     Palpations: Abdomen is soft.     Tenderness: There is no abdominal tenderness. There is no guarding or rebound.  Musculoskeletal:        General: Normal range of motion.     Cervical back: Normal range of motion and neck supple. No tenderness.     Lumbar back: Tenderness present. Normal range of motion.       Back:     Right lower leg: No edema.     Left lower leg: No edema.     Comments: No edema  Skin:    General: Skin is warm and dry.     Findings: No rash.  Neurological:     General: No focal deficit present.     Mental Status: She is alert and oriented to person, place, and time. Mental status is at baseline.     Cranial Nerves: No cranial nerve deficit.     Sensory: No sensory deficit.     Motor: No weakness.     Coordination: Coordination normal.  Psychiatric:        Mood and Affect: Mood normal.        Behavior: Behavior normal.        Thought Content: Thought content normal.      ED Results / Procedures / Treatments   Labs (all labs ordered are listed, but only abnormal results are displayed) Labs Reviewed  CBC WITH DIFFERENTIAL/PLATELET - Abnormal; Notable for  the following components:      Result Value   WBC 10.7 (*)    RBC 3.67 (*)    Hemoglobin 11.5 (*)    HCT 32.7 (*)    All other components within normal limits  COMPREHENSIVE METABOLIC PANEL - Abnormal; Notable for the following components:   Potassium 3.1 (*)    Glucose, Bld  133 (*)    BUN 25 (*)    Creatinine, Ser 2.08 (*)    Calcium 8.8 (*)    GFR, Estimated 26 (*)    All other components within normal limits  URINALYSIS, ROUTINE W REFLEX MICROSCOPIC - Abnormal; Notable for the following components:   APPearance CLOUDY (*)    Specific Gravity, Urine >1.030 (*)    Bilirubin Urine SMALL (*)    Ketones, ur 15 (*)    Protein, ur 30 (*)    Leukocytes,Ua MODERATE (*)    All other components within normal limits  URINALYSIS, MICROSCOPIC (REFLEX) - Abnormal; Notable for the following components:   Bacteria, UA MANY (*)    All other components within normal limits  CBG MONITORING, ED - Abnormal; Notable for the following components:   Glucose-Capillary 125 (*)    All other components within normal limits  RESPIRATORY PANEL BY RT PCR (FLU A&B, COVID)    EKG EKG Interpretation  Date/Time:  Wednesday August 07 2020 21:29:57 EDT Ventricular Rate:  87 PR Interval:    QRS Duration: 98 QT Interval:  389 QTC Calculation: 468 R Axis:   77 Text Interpretation: Sinus rhythm Normal ECG No previous tracing Confirmed by Gwyneth Sprout ([id number]) on 08/07/2020 9:44:41 PM   Radiology No results found.  Procedures Procedures (including critical care time)  Medications Ordered in ED Medications  lactated ringers bolus 1,000 mL (1,000 mLs Intravenous New Bag/Given 08/07/20 2227)    ED Course  I have reviewed the triage vital signs and the nursing notes.  Pertinent labs & imaging results that were available during my care of the patient were reviewed by me and considered in my medical decision making (see chart for details).    MDM Rules/Calculators/A&P                           Patient is a 59 year old female presenting today with persistent dizziness and near syncope. She reports symptoms started yesterday and she also generally just was not feeling well and did not have good oral intake yesterday. Today symptoms were worse. She reports while she is laying down she feels fine but when she tries to stand and walk her vision starts going black she starts stumbling and needs to lay down. She has had no chest pain or shortness of breath. She denies any abdominal pain. She denies any urinary symptoms but reports since yesterday she is also had very uncomfortable lower back pain. She does have a history of scoliosis but reports she has not had back pain like this before. It does not radiate down into her legs and causes no new numbness or tingling of her legs. She has had no bowel or bladder incontinence. On exam strength is normal in bilateral lower extremities. She has no abdominal pain. However patient has been persistently hypotensive here between 80s and 90s systolic. Otherwise she is well-appearing. However on labs today patient has new AKI with creatinine of 2.08 from baseline of 0.9 earlier this year. Urine also shows moderate leukocytes with many bacteria and 21-50 white cells with minimal contaminant. Blood sugar is within normal limits and CBC without acute findings. We will do imaging of the back and abdomen to ensure no AAA or acute spinal pathology. Low suspicion for cord compression. Patient given IV fluids and will continue to follow blood pressure. Patient does take an ACE inhibitor as well as amlodipine and possibly hydrochlorothiazide. These could be the cause of  her AKI and hypotension. She denies any Covid-like symptoms or ACS symptoms at this time. Low suspicion for PE.  MDM Number of Diagnoses or Management Options   Amount and/or Complexity of Data Reviewed Clinical lab tests: ordered and reviewed Tests in the radiology section of CPT: ordered and  reviewed Tests in the medicine section of CPT: ordered and reviewed Decide to obtain previous medical records or to obtain history from someone other than the patient: yes Obtain history from someone other than the patient: no Review and summarize past medical records: yes Independent visualization of images, tracings, or specimens: yes  Risk of Complications, Morbidity, and/or Mortality Presenting problems: high Diagnostic procedures: low Management options: low  Patient Progress Patient progress: stable  Final Clinical Impression(s) / ED Diagnoses Final diagnoses:  None    Rx / DC Orders ED Discharge Orders    None       Gwyneth SproutPlunkett, Duy Lemming, MD 08/07/20 [phone]

## 2020-08-07 NOTE — ED Triage Notes (Signed)
C/o dizziness x 2 days, bil ear pain

## 2020-08-08 LAB — CREATININE, SERUM
Creatinine, Ser: 1.73 mg/dL — ABNORMAL HIGH (ref 0.44–1.00)
GFR, Estimated: 32 mL/min — ABNORMAL LOW (ref >60)

## 2020-08-08 MED ORDER — CEPHALEXIN 500 MG PO CAPS: 500.0000 mg | ORAL_CAPSULE | Freq: Two times a day (BID) | ORAL | 0 refills | Status: DC

## 2020-08-08 NOTE — ED Notes (Signed)
Pt ambulated. States feels better. Denies lightheaded.

## 2020-08-08 NOTE — ED Provider Notes (Addendum)
Nursing notes and vitals signs, including pulse oximetry, reviewed.  Summary of this visit's results, reviewed by myself:  EKG:  EKG Interpretation  Date/Time:  Wednesday August 07 2020 21:29:57 EDT Ventricular Rate:  87 PR Interval:    QRS Duration: 98 QT Interval:  389 QTC Calculation: 468 R Axis:   77 Text Interpretation: Sinus rhythm Normal ECG No previous tracing Confirmed by Gwyneth Sprout ([id number]) on 08/07/2020 9:44:41 PM       Labs:  Results for orders placed or performed during the hospital encounter of 08/07/20 (from the past 24 hour(s))  CBC with Differential     Status: Abnormal   Collection Time: 08/07/20  9:37 PM  Result Value Ref Range   WBC 10.7 (H) 4.0 - 10.5 K/uL   RBC 3.67 (L) 3.87 - 5.11 MIL/uL   Hemoglobin 11.5 (L) 12.0 - 15.0 g/dL   HCT 17.5 (L) 36 - 46 %   MCV 89.1 80.0 - 100.0 fL   MCH 31.3 26.0 - 34.0 pg   MCHC 35.2 30.0 - 36.0 g/dL   RDW 10.2 58.5 - 27.7 %   Platelets 241 150 - 400 K/uL   nRBC 0.0 0.0 - 0.2 %   Neutrophils Relative % 66 %   Neutro Abs 7.2 1.7 - 7.7 K/uL   Lymphocytes Relative 25 %   Lymphs Abs 2.7 0.7 - 4.0 K/uL   Monocytes Relative 6 %   Monocytes Absolute 0.6 0.1 - 1.0 K/uL   Eosinophils Relative 1 %   Eosinophils Absolute 0.1 0.0 - 0.5 K/uL   Basophils Relative 1 %   Basophils Absolute 0.1 0.0 - 0.1 K/uL   Immature Granulocytes 1 %   Abs Immature Granulocytes 0.06 0.00 - 0.07 K/uL  Comprehensive metabolic panel     Status: Abnormal   Collection Time: 08/07/20  9:37 PM  Result Value Ref Range   Sodium 136 135 - 145 mmol/L   Potassium 3.1 (L) 3.5 - 5.1 mmol/L   Chloride 102 98 - 111 mmol/L   CO2 22 22 - 32 mmol/L   Glucose, Bld 133 (H) 70 - 99 mg/dL   BUN 25 (H) 6 - 20 mg/dL   Creatinine, Ser 8.24 (H) 0.44 - 1.00 mg/dL   Calcium 8.8 (L) 8.9 - 10.3 mg/dL   Total Protein 7.3 6.5 - 8.1 g/dL   Albumin 4.4 3.5 - 5.0 g/dL   AST 27 15 - 41 U/L   ALT 18 0 - 44 U/L   Alkaline Phosphatase 42 38 - 126 U/L   Total  Bilirubin 0.9 0.3 - 1.2 mg/dL   GFR, Estimated 26 (L) >60 mL/min   Anion gap 12 5 - 15  CBG monitoring, ED     Status: Abnormal   Collection Time: 08/07/20  9:42 PM  Result Value Ref Range   Glucose-Capillary 125 (H) 70 - 99 mg/dL  Urinalysis, Routine w reflex microscopic Urine, Clean Catch     Status: Abnormal   Collection Time: 08/07/20 10:20 PM  Result Value Ref Range   Color, Urine YELLOW YELLOW   APPearance CLOUDY (A) CLEAR   Specific Gravity, Urine >1.030 (H) 1.005 - 1.030   pH 5.5 5.0 - 8.0   Glucose, UA NEGATIVE NEGATIVE mg/dL   Hgb urine dipstick NEGATIVE NEGATIVE   Bilirubin Urine SMALL (A) NEGATIVE   Ketones, ur 15 (A) NEGATIVE mg/dL   Protein, ur 30 (A) NEGATIVE mg/dL   Nitrite NEGATIVE NEGATIVE   Leukocytes,Ua MODERATE (A) NEGATIVE  Urinalysis, Microscopic (reflex)  Status: Abnormal   Collection Time: 08/07/20 10:20 PM  Result Value Ref Range   RBC / HPF 11-20 0 - 5 RBC/hpf   WBC, UA 21-50 0 - 5 WBC/hpf   Bacteria, UA MANY (A) NONE SEEN   Squamous Epithelial / LPF 6-10 0 - 5   Mucus PRESENT    Hyaline Casts, UA PRESENT   Respiratory Panel by RT PCR (Flu A&B, Covid) - Nasopharyngeal Swab     Status: None   Collection Time: 08/07/20 10:34 PM   Specimen: Nasopharyngeal Swab  Result Value Ref Range   SARS Coronavirus 2 by RT PCR NEGATIVE NEGATIVE   Influenza A by PCR NEGATIVE NEGATIVE   Influenza B by PCR NEGATIVE NEGATIVE  Creatinine, serum     Status: Abnormal   Collection Time: 08/08/20 12:53 AM  Result Value Ref Range   Creatinine, Ser 1.73 (H) 0.44 - 1.00 mg/dL   GFR, Estimated 32 (L) >60 mL/min    Imaging Studies: CT ABDOMEN PELVIS WO CONTRAST  Result Date: 08/07/2020 CLINICAL DATA:  Low back pain, increased fracture risk, hypotension. Rule like abdominal aorta aneurysm. New acute renal injury. EXAM: CT ABDOMEN AND PELVIS WITHOUT CONTRAST TECHNIQUE: Multidetector CT imaging of the abdomen and pelvis was performed following the standard protocol  without IV contrast. COMPARISON:  CT abdomen pelvis 02/01/2018. FINDINGS: Lower chest: 3 mm right lower lobe granuloma calcified. Scarring within left lower lobe. Hepatobiliary: No focal liver abnormality is seen. Status post cholecystectomy. No biliary dilatation. Pancreas: Unremarkable. No pancreatic ductal dilatation or surrounding inflammatory changes. Spleen: Normal in size without focal abnormality. Adrenals/Urinary Tract: Adrenal glands are unremarkable. Kidneys are normal, without renal calculi, focal lesion, or hydronephrosis. Bladder is unremarkable. Stomach/Bowel: Stomach is within normal limits. Appendix appears normal. No evidence of bowel wall thickening, distention, or inflammatory changes. Vascular/Lymphatic: No abdominal aorta or iliac aneurysm. Mild calcified atherosclerotic plaque of the iliac arteries. No abdominal, pelvic, or inguinal lymphadenopathy. Reproductive: Uterus and bilateral adnexa are unremarkable. Other: No intraperitoneal free fluid. No intraperitoneal free gas. No organized fluid collection. Musculoskeletal: Small fat containing umbilical hernia with an abdominal defect of 8 mm. Partially visualized thoracic scoliosis surgical hardware. Similar-appearing grade 1 anterolisthesis of L4 on L5. Similar-appearing sclerotic lesion within the right iliac bone. No suspicious lytic or blastic osseous lesions. No acute displaced fracture. Multilevel degenerative changes of the spine. IMPRESSION: No acute intra-abdominal or intrapelvic abnormality with no abdominal aorta aneurysm identified. Electronically Signed   By: Tish Frederickson M.D.   On: 08/07/2020 23:12   12:50 AM Patient able to ambulate, states her dizziness is improved after 2 L LR.  Urinalysis is consistent with a urinary tract infection despite lack of dysuria.  Patient given 1 g Rocephin IV and urine sent for culture.  1:24 AM Creatinine has improved with hydration and her urinalysis was consistent with dehydration.  We  will have her follow-up with her PCP for recheck.   Paula Libra, MD 08/08/20 [id number]    Paula Libra, MD 08/08/20 6945

## 2020-08-09 LAB — URINE CULTURE

## 2022-01-02 DIAGNOSIS — D709 Neutropenia, unspecified: Secondary | ICD-10-CM | POA: Insufficient documentation

## 2022-01-02 DIAGNOSIS — Z79899 Other long term (current) drug therapy: Secondary | ICD-10-CM | POA: Insufficient documentation

## 2022-01-02 DIAGNOSIS — R1110 Vomiting, unspecified: Secondary | ICD-10-CM | POA: Insufficient documentation

## 2022-01-02 DIAGNOSIS — R519 Headache, unspecified: Secondary | ICD-10-CM | POA: Insufficient documentation

## 2022-01-02 DIAGNOSIS — I10 Essential (primary) hypertension: Secondary | ICD-10-CM | POA: Insufficient documentation

## 2022-01-03 ENCOUNTER — Other Ambulatory Visit: Payer: Self-pay

## 2022-01-03 ENCOUNTER — Emergency Department (HOSPITAL_BASED_OUTPATIENT_CLINIC_OR_DEPARTMENT_OTHER)
Admission: EM | Admit: 2022-01-03 | Discharge: 2022-01-03 | Disposition: A | Discharge status: Home / Self Care | Payer: Medicare Other | Attending: Emergency Medicine | Admitting: Emergency Medicine

## 2022-01-03 ENCOUNTER — Encounter (HOSPITAL_BASED_OUTPATIENT_CLINIC_OR_DEPARTMENT_OTHER): Payer: Self-pay | Admitting: Emergency Medicine

## 2022-01-03 ENCOUNTER — Emergency Department (HOSPITAL_BASED_OUTPATIENT_CLINIC_OR_DEPARTMENT_OTHER): Payer: Medicare Other

## 2022-01-03 DIAGNOSIS — D709 Neutropenia, unspecified: Secondary | ICD-10-CM

## 2022-01-03 DIAGNOSIS — R519 Headache, unspecified: Secondary | ICD-10-CM

## 2022-01-03 LAB — CBC WITH DIFFERENTIAL/PLATELET
Abs Immature Granulocytes: 0.01 10*3/uL (ref 0.00–0.07)
Basophils Absolute: 0 10*3/uL (ref 0.0–0.1)
Basophils Relative: 2 %
Eosinophils Absolute: 0 10*3/uL (ref 0.0–0.5)
Eosinophils Relative: 1 %
HCT: 27.4 % — ABNORMAL LOW (ref 36.0–46.0)
Hemoglobin: 10 g/dL — ABNORMAL LOW (ref 12.0–15.0)
Immature Granulocytes: 1 %
Lymphocytes Relative: 68 %
Lymphs Abs: 1.4 10*3/uL (ref 0.7–4.0)
MCH: 30.6 pg (ref 26.0–34.0)
MCHC: 36.5 g/dL — ABNORMAL HIGH (ref 30.0–36.0)
MCV: 83.8 fL (ref 80.0–100.0)
Monocytes Absolute: 0.1 10*3/uL (ref 0.1–1.0)
Monocytes Relative: 7 %
Neutro Abs: 0.4 10*3/uL — CL (ref 1.7–7.7)
Neutrophils Relative %: 21 %
Platelets: 180 10*3/uL (ref 150–400)
RBC: 3.27 MIL/uL — ABNORMAL LOW (ref 3.87–5.11)
RDW: 20.2 % — ABNORMAL HIGH (ref 11.5–15.5)
Smear Review: NORMAL
WBC: 2 10*3/uL — ABNORMAL LOW (ref 4.0–10.5)
nRBC: 1 % — ABNORMAL HIGH (ref 0.0–0.2)

## 2022-01-03 LAB — BASIC METABOLIC PANEL
Anion gap: 16 — ABNORMAL HIGH (ref 5–15)
BUN: 6 mg/dL (ref 6–20)
CO2: 19 mmol/L — ABNORMAL LOW (ref 22–32)
Calcium: 8.6 mg/dL — ABNORMAL LOW (ref 8.9–10.3)
Chloride: 104 mmol/L (ref 98–111)
Creatinine, Ser: 0.75 mg/dL (ref 0.44–1.00)
GFR, Estimated: >60 mL/min (ref >60)
Glucose, Bld: 79 mg/dL (ref 70–99)
Potassium: 4.1 mmol/L (ref 3.5–5.1)
Sodium: 139 mmol/L (ref 135–145)

## 2022-01-03 MED ORDER — KETOROLAC TROMETHAMINE 15 MG/ML IJ SOLN
15.0000 mg | Freq: Once | INTRAMUSCULAR | Status: AC
  Administered 2022-01-03: 15 mg via INTRAVENOUS
  Filled 2022-01-03: qty 1

## 2022-01-03 MED ORDER — METOCLOPRAMIDE HCL 5 MG/ML IJ SOLN
10.0000 mg | Freq: Once | INTRAMUSCULAR | Status: AC
Start: 2022-01-03 — End: 2022-01-03
  Administered 2022-01-03: 10 mg via INTRAVENOUS
  Filled 2022-01-03: qty 2

## 2022-01-03 MED ORDER — SODIUM CHLORIDE 0.9 % IV BOLUS
500.0000 mL | Freq: Once | INTRAVENOUS | Status: AC
  Administered 2022-01-03: 500 mL via INTRAVENOUS

## 2022-01-03 NOTE — Discharge Instructions (Signed)
Your white blood cell count was low today.  Please follow up closely with your family doctor.  Get rechecked sooner if you have any new or concerning symptoms.   ?

## 2022-01-03 NOTE — ED Provider Notes (Signed)
?[facility] EMERGENCY DEPARTMENT ?Provider Note ? ? ?CSN: [id number] ?Arrival date & time: 01/02/22  2356 ? ?  ? ?History ? ?Chief Complaint  ?Patient presents with  ? Headache  ? ? ?Alice Riley is a 61 y.o. female. ? ?The history is provided by the patient.  ?Headache ?Alice Riley is a 61 y.o. female who presents to the Emergency Department complaining of HA.  She presents to the ED accompanied by her husband for evaluation of HA that started around 4pm this afternoon.  Took tylenol.  Pain is described as aching, and involves entire head.  Pain is constant.  Feels dizzy.  Has difficulty walking.  No vision changes.  Had an emesis.  No fever.  Feels globally weak.  No exposure to carbon monoxide.  No recent illnesses. ?No hormone use. ?No hx/o HA.   ? ?Has HTN, HPL.   ? ?No injuries.   ? ?No tobacco.  Occ alcohol.  No drugs.   ?  ? ?Home Medications ?Prior to Admission medications   ?Medication Sig Start Date End Date Taking? Authorizing Provider  ?amLODipine (NORVASC) 5 MG tablet Take 5 mg by mouth daily. 04/01/20   [provider]  ?atorvastatin (LIPITOR) 80 MG tablet Take by mouth. 09/26/18   [provider]  ?cephALEXin (KEFLEX) 500 MG capsule Take 1 capsule (500 mg total) by mouth 2 (two) times daily. 08/08/20   Molpus, John, MD  ?ciprofloxacin-dexamethasone (CIPRODEX) OTIC suspension  06/13/20   [provider]  ?diclofenac Sodium (VOLTAREN) 1 % GEL Apply topically. 07/22/20   [provider]  ?fenofibrate 54 MG tablet  07/15/20   [provider]  ?hydrochlorothiazide (HYDRODIURIL) 25 MG tablet Take by mouth. 06/16/17   [provider]  ?lisinopril (PRINIVIL,ZESTRIL) 20 MG tablet Take 20 mg by mouth daily. 12/18/18   [provider]  ?lisinopril (ZESTRIL) 10 MG tablet Take 10 mg by mouth daily. 05/30/20   [provider]  ?olopatadine (PATANOL) 0.1 % ophthalmic solution PLACE 1 DROP INTO BOTH EYES 2 (TWO) TIMES DAILY AS NEEDED FOR  ALLERGIES. 06/03/20   [provider]  ?ondansetron (ZOFRAN) 4 MG tablet Take 1 tablet (4 mg total) by mouth every 8 (eight) hours as needed for nausea. 08/14/13   Trixie Dredge, PA-C  ?pregabalin (LYRICA) 100 MG capsule Take 100 mg by mouth 2 (two) times daily. 11/11/18   [provider]  ?valACYclovir (VALTREX) 1000 MG tablet Take 1,000 mg by mouth daily. 05/26/20   [provider]  ?   ? ?Allergies    ?Patient has no known allergies.   ? ?Review of Systems   ?Review of Systems  ?Neurological:  Positive for headaches.  ?All other systems reviewed and are negative. ? ?Physical Exam ?Updated Vital Signs ?BP 114/76 (BP Location: Right Arm)   Pulse 76   Temp 97.9 ?F (36.6 ?C) (Oral)   Resp 16   Ht 5\' 6"  (1.676 m)   Wt 70.8 kg   LMP 10/01/2009   SpO2 98%   BMI 25.19 kg/m?  ?Physical Exam ?Vitals and nursing note reviewed.  ?Constitutional:   ?   Appearance: She is well-developed.  ?HENT:  ?   Head: Normocephalic and atraumatic.  ?Cardiovascular:  ?   Rate and Rhythm: Normal rate and regular rhythm.  ?   Heart sounds: No murmur heard. ?Pulmonary:  ?   Effort: Pulmonary effort is normal. No respiratory distress.  ?   Breath sounds: Normal breath sounds.  ?Abdominal:  ?  Palpations: Abdomen is soft.  ?   Tenderness: There is no abdominal tenderness. There is no guarding or rebound.  ?Musculoskeletal:     ?   General: No tenderness.  ?Skin: ?   General: Skin is warm and dry.  ?Neurological:  ?   Mental Status: She is alert and oriented to person, place, and time.  ?   Comments: PERRL, EOMI.  5/5 strength in all four extremities with sensation to light touch intact in all four extremities.  Visual fields grossly intact.    ?Psychiatric:     ?   Behavior: Behavior normal.  ? ? ?ED Results / Procedures / Treatments   ?Labs ?(all labs ordered are listed, but only abnormal results are displayed) ?Labs Reviewed  ?BASIC METABOLIC PANEL - Abnormal; Notable for the following components:  ?    Result  Value  ? CO2 19 (*)   ? Calcium 8.6 (*)   ? Anion gap 16 (*)   ? All other components within normal limits  ?CBC WITH DIFFERENTIAL/PLATELET - Abnormal; Notable for the following components:  ? WBC 2.0 (*)   ? RBC 3.27 (*)   ? Hemoglobin 10.0 (*)   ? HCT 27.4 (*)   ? MCHC 36.5 (*)   ? RDW 20.2 (*)   ? nRBC 1.0 (*)   ? Neutro Abs 0.4 (*)   ? All other components within normal limits  ?PATHOLOGIST SMEAR REVIEW  ? ? ?EKG ?None ? ?Radiology ?CT Head Wo Contrast ? ?Result Date: 01/03/2022 ?CLINICAL DATA:  Headache. EXAM: CT HEAD WITHOUT CONTRAST TECHNIQUE: Contiguous axial images were obtained from the base of the skull through the vertex without intravenous contrast. RADIATION DOSE REDUCTION: This exam was performed according to the departmental dose-optimization program which includes automated exposure control, adjustment of the mA and/or kV according to patient size and/or use of iterative reconstruction technique. COMPARISON:  None. FINDINGS: Brain: Mild diffuse cortical atrophy. The gray-white matter discrimination is preserved. There is no acute intracranial hemorrhage. No mass effect or midline shift. No extra-axial fluid collection. Vascular: No hyperdense vessel or unexpected calcification. Skull: Normal. Negative for fracture or focal lesion. Sinuses/Orbits: No acute finding. Other: None IMPRESSION: No acute intracranial pathology. Electronically Signed   By: Elgie Collard M.D.   On: 01/03/2022 01:38   ? ?Procedures ?Procedures  ? ? ?Medications Ordered in ED ?Medications  ?metoCLOPramide (REGLAN) injection 10 mg (10 mg Intravenous Given 01/03/22 0112)  ?sodium chloride 0.9 % bolus 500 mL (500 mLs Intravenous New Bag/Given 01/03/22 0345)  ?ketorolac (TORADOL) 15 MG/ML injection 15 mg (15 mg Intravenous Given 01/03/22 0340)  ? ? ?ED Course/ Medical Decision Making/ A&P ?  ?                        ?Medical Decision Making ?Amount and/or Complexity of Data Reviewed ?Labs: ordered. ?Radiology:  ordered. ? ?Risk ?Prescription drug management. ? ? ?Patient here for evaluation of headache, dizziness.  She is nontoxic-appearing on evaluation, neck is supple.  She is neurologically intact on evaluation.  CT head is negative for acute abnormality.  She was treated with medications for her headache with improvement in her symptoms.  CBC with stable anemia.  She does have leukopenia, which is new for her.  She has no current infectious symptoms and current clinical picture is not consistent with sepsis/meningitis.  Discussed with patient unclear source of this leukopenia and feel it is unrelated to her current process.  Discussed  importance of PCP follow-up for further evaluation.  Current clinical picture is not consistent with CVA, subarachnoid hemorrhage, dural sinus thrombosis, meningitis.  Patient's headache is significantly improved after medications in the emergency department.  Plan to discharge home with PCP follow-up and return precautions. ? ? ? ? ? ? ? ?Final Clinical Impression(s) / ED Diagnoses ?Final diagnoses:  ?Bad headache  ?Neutropenia, unspecified type (HCC)  ? ? ?Rx / DC Orders ?ED Discharge Orders   ? ? None  ? ?  ? ? ?  ?Tilden Fossaees, Kena Limon, MD ?01/03/22 0502 ? ?

## 2022-01-03 NOTE — ED Notes (Signed)
Patient ambulated independently without complaint of dizziness or headache.  Made it approximately 50 ft then began to complain of R knee pain.  Patient then had to sit in wheelchair back to room.  Patient noted to have shuffling gait.  When instructed to walk to the left, patient noted to have a pause when determining which direction was left.  ED physician notified of the findings.   ?

## 2022-01-03 NOTE — ED Triage Notes (Signed)
C/o severe headache and nausea that began earlier today. Unable to find relief with tylenol. A&o x 4.  ?

## 2022-01-03 NOTE — ED Notes (Signed)
Patient discharged to home.  All discharge instructions reviewed.  Patient verbalized understanding via teachback method.  VS WDL.  Respirations even and unlabored.  Wheelchair out of ED.   ?

## 2022-01-03 NOTE — ED Notes (Signed)
Patient transported to CT 

## 2022-01-05 LAB — PATHOLOGIST SMEAR REVIEW

## 2022-03-26 ENCOUNTER — Other Ambulatory Visit: Payer: Self-pay

## 2022-03-26 ENCOUNTER — Encounter (HOSPITAL_BASED_OUTPATIENT_CLINIC_OR_DEPARTMENT_OTHER): Payer: Self-pay | Admitting: Emergency Medicine

## 2022-03-26 ENCOUNTER — Inpatient Hospital Stay (HOSPITAL_BASED_OUTPATIENT_CLINIC_OR_DEPARTMENT_OTHER)
Admission: EM | Admit: 2022-03-26 | Discharge: 2022-03-29 | DRG: 378 | Disposition: A | Discharge status: Home / Self Care | Payer: Medicare Other | Attending: Internal Medicine | Admitting: Internal Medicine

## 2022-03-26 DIAGNOSIS — D62 Acute posthemorrhagic anemia: Secondary | ICD-10-CM | POA: Diagnosis present

## 2022-03-26 DIAGNOSIS — Z833 Family history of diabetes mellitus: Secondary | ICD-10-CM | POA: Diagnosis not present

## 2022-03-26 DIAGNOSIS — M419 Scoliosis, unspecified: Secondary | ICD-10-CM | POA: Diagnosis present

## 2022-03-26 DIAGNOSIS — Z8249 Family history of ischemic heart disease and other diseases of the circulatory system: Secondary | ICD-10-CM | POA: Diagnosis not present

## 2022-03-26 DIAGNOSIS — F1010 Alcohol abuse, uncomplicated: Secondary | ICD-10-CM | POA: Diagnosis present

## 2022-03-26 DIAGNOSIS — Z91013 Allergy to seafood: Secondary | ICD-10-CM | POA: Diagnosis not present

## 2022-03-26 DIAGNOSIS — E538 Deficiency of other specified B group vitamins: Secondary | ICD-10-CM | POA: Diagnosis present

## 2022-03-26 DIAGNOSIS — Z789 Other specified health status: Secondary | ICD-10-CM

## 2022-03-26 DIAGNOSIS — I10 Essential (primary) hypertension: Secondary | ICD-10-CM | POA: Diagnosis present

## 2022-03-26 DIAGNOSIS — Z79899 Other long term (current) drug therapy: Secondary | ICD-10-CM | POA: Diagnosis not present

## 2022-03-26 DIAGNOSIS — K3189 Other diseases of stomach and duodenum: Secondary | ICD-10-CM | POA: Diagnosis present

## 2022-03-26 DIAGNOSIS — N3001 Acute cystitis with hematuria: Secondary | ICD-10-CM

## 2022-03-26 DIAGNOSIS — M792 Neuralgia and neuritis, unspecified: Secondary | ICD-10-CM | POA: Diagnosis not present

## 2022-03-26 DIAGNOSIS — N390 Urinary tract infection, site not specified: Secondary | ICD-10-CM | POA: Diagnosis present

## 2022-03-26 DIAGNOSIS — K254 Chronic or unspecified gastric ulcer with hemorrhage: Secondary | ICD-10-CM | POA: Diagnosis present

## 2022-03-26 DIAGNOSIS — D6959 Other secondary thrombocytopenia: Secondary | ICD-10-CM | POA: Diagnosis present

## 2022-03-26 DIAGNOSIS — K922 Gastrointestinal hemorrhage, unspecified: Secondary | ICD-10-CM | POA: Diagnosis present

## 2022-03-26 DIAGNOSIS — G629 Polyneuropathy, unspecified: Secondary | ICD-10-CM | POA: Diagnosis present

## 2022-03-26 DIAGNOSIS — E782 Mixed hyperlipidemia: Secondary | ICD-10-CM | POA: Diagnosis present

## 2022-03-26 DIAGNOSIS — E876 Hypokalemia: Secondary | ICD-10-CM | POA: Diagnosis not present

## 2022-03-26 DIAGNOSIS — K259 Gastric ulcer, unspecified as acute or chronic, without hemorrhage or perforation: Secondary | ICD-10-CM | POA: Diagnosis not present

## 2022-03-26 LAB — CBC WITH DIFFERENTIAL/PLATELET
Abs Immature Granulocytes: 0.02 10*3/uL (ref 0.00–0.07)
Basophils Absolute: 0.1 10*3/uL (ref 0.0–0.1)
Basophils Relative: 1 %
Eosinophils Absolute: 0 10*3/uL (ref 0.0–0.5)
Eosinophils Relative: 0 %
HCT: 19.5 % — ABNORMAL LOW (ref 36.0–46.0)
Hemoglobin: 7 g/dL — ABNORMAL LOW (ref 12.0–15.0)
Immature Granulocytes: 0 %
Lymphocytes Relative: 13 %
Lymphs Abs: 0.9 10*3/uL (ref 0.7–4.0)
MCH: 31 pg (ref 26.0–34.0)
MCHC: 35.9 g/dL (ref 30.0–36.0)
MCV: 86.3 fL (ref 80.0–100.0)
Monocytes Absolute: 0.2 10*3/uL (ref 0.1–1.0)
Monocytes Relative: 3 %
Neutro Abs: 5.6 10*3/uL (ref 1.7–7.7)
Neutrophils Relative %: 83 %
Platelets: 148 10*3/uL — ABNORMAL LOW (ref 150–400)
RBC: 2.26 MIL/uL — ABNORMAL LOW (ref 3.87–5.11)
RDW: 21.9 % — ABNORMAL HIGH (ref 11.5–15.5)
Smear Review: NORMAL
WBC: 6.7 10*3/uL (ref 4.0–10.5)
nRBC: 0 % (ref 0.0–0.2)

## 2022-03-26 LAB — COMPREHENSIVE METABOLIC PANEL
ALT: 13 U/L (ref 0–44)
AST: 26 U/L (ref 15–41)
Albumin: 4.2 g/dL (ref 3.5–5.0)
Alkaline Phosphatase: 42 U/L (ref 38–126)
Anion gap: 12 (ref 5–15)
BUN: 8 mg/dL (ref 6–20)
CO2: 22 mmol/L (ref 22–32)
Calcium: 9 mg/dL (ref 8.9–10.3)
Chloride: 103 mmol/L (ref 98–111)
Creatinine, Ser: 0.52 mg/dL (ref 0.44–1.00)
GFR, Estimated: >60 mL/min (ref >60)
Glucose, Bld: 90 mg/dL (ref 70–99)
Potassium: 3.7 mmol/L (ref 3.5–5.1)
Sodium: 137 mmol/L (ref 135–145)
Total Bilirubin: 1.5 mg/dL — ABNORMAL HIGH (ref 0.3–1.2)
Total Protein: 7.2 g/dL (ref 6.5–8.1)

## 2022-03-26 LAB — LIPASE, BLOOD: Lipase: 27 U/L (ref 11–51)

## 2022-03-26 LAB — CBC
HCT: 20.6 % — ABNORMAL LOW (ref 36.0–46.0)
Hemoglobin: 7.2 g/dL — ABNORMAL LOW (ref 12.0–15.0)
MCH: 31.2 pg (ref 26.0–34.0)
MCHC: 35 g/dL (ref 30.0–36.0)
MCV: 89.2 fL (ref 80.0–100.0)
Platelets: 132 10*3/uL — ABNORMAL LOW (ref 150–400)
RBC: 2.31 MIL/uL — ABNORMAL LOW (ref 3.87–5.11)
RDW: 21.8 % — ABNORMAL HIGH (ref 11.5–15.5)
WBC: 5.1 10*3/uL (ref 4.0–10.5)
nRBC: 0 % (ref 0.0–0.2)

## 2022-03-26 LAB — URINALYSIS, MICROSCOPIC (REFLEX): WBC, UA: >50 WBC/hpf (ref 0–5)

## 2022-03-26 LAB — URINALYSIS, ROUTINE W REFLEX MICROSCOPIC
Bilirubin Urine: NEGATIVE
Glucose, UA: NEGATIVE mg/dL
Ketones, ur: 40 mg/dL — AB
Nitrite: POSITIVE — AB
Protein, ur: 30 mg/dL — AB
Specific Gravity, Urine: 1.02 (ref 1.005–1.030)
pH: 7.5 (ref 5.0–8.0)

## 2022-03-26 LAB — PROTIME-INR
INR: 1.1 (ref 0.8–1.2)
Prothrombin Time: 14.5 seconds (ref 11.4–15.2)

## 2022-03-26 LAB — PREPARE RBC (CROSSMATCH)

## 2022-03-26 LAB — ABO/RH: ABO/RH(D): A POS

## 2022-03-26 LAB — OCCULT BLOOD X 1 CARD TO LAB, STOOL: Fecal Occult Bld: POSITIVE — AB

## 2022-03-26 LAB — MRSA NEXT GEN BY PCR, NASAL: MRSA by PCR Next Gen: NOT DETECTED

## 2022-03-26 LAB — HIV ANTIBODY (ROUTINE TESTING W REFLEX): HIV Screen 4th Generation wRfx: NONREACTIVE

## 2022-03-26 MED ORDER — ACETAMINOPHEN 325 MG PO TABS
650.0000 mg | ORAL_TABLET | Freq: Four times a day (QID) | ORAL | Status: DC | PRN
  Administered 2022-03-26 – 2022-03-28 (×4): 650 mg via ORAL
  Filled 2022-03-26 (×4): qty 2

## 2022-03-26 MED ORDER — PANTOPRAZOLE SODIUM 40 MG IV SOLR
40.0000 mg | Freq: Once | INTRAVENOUS | Status: AC
Start: 2022-03-26 — End: 2022-03-26
  Administered 2022-03-26: 40 mg via INTRAVENOUS
  Filled 2022-03-26: qty 10

## 2022-03-26 MED ORDER — LORAZEPAM 1 MG PO TABS
0.0000 mg | ORAL_TABLET | Freq: Four times a day (QID) | ORAL | Status: AC
  Administered 2022-03-26 – 2022-03-27 (×2): 1 mg via ORAL
  Filled 2022-03-26 (×2): qty 1

## 2022-03-26 MED ORDER — CHLORHEXIDINE GLUCONATE CLOTH 2 % EX PADS
6.0000 | MEDICATED_PAD | Freq: Every day | CUTANEOUS | Status: DC
  Administered 2022-03-26 – 2022-03-29 (×4): 6 via TOPICAL

## 2022-03-26 MED ORDER — LORAZEPAM 1 MG PO TABS: 0.0000 mg | ORAL_TABLET | Freq: Two times a day (BID) | ORAL | Status: DC

## 2022-03-26 MED ORDER — SODIUM CHLORIDE 0.9 % IV SOLN
1.0000 g | INTRAVENOUS | Status: DC
  Administered 2022-03-27: 1 g via INTRAVENOUS
  Filled 2022-03-26 (×2): qty 10

## 2022-03-26 MED ORDER — PANTOPRAZOLE 80MG IVPB - SIMPLE MED
80.0000 mg | Freq: Once | INTRAVENOUS | Status: DC
  Filled 2022-03-26: qty 100

## 2022-03-26 MED ORDER — ATORVASTATIN CALCIUM 40 MG PO TABS
80.0000 mg | ORAL_TABLET | Freq: Every day | ORAL | Status: DC
  Administered 2022-03-27 – 2022-03-29 (×3): 80 mg via ORAL
  Filled 2022-03-26 (×3): qty 2

## 2022-03-26 MED ORDER — SODIUM CHLORIDE 0.9 % IV SOLN: INTRAVENOUS | Status: DC

## 2022-03-26 MED ORDER — PANTOPRAZOLE INFUSION (NEW) - SIMPLE MED
8.0000 mg/h | INTRAVENOUS | Status: DC
  Administered 2022-03-26 – 2022-03-28 (×5): 8 mg/h via INTRAVENOUS
  Filled 2022-03-26: qty 100
  Filled 2022-03-26 (×3): qty 80
  Filled 2022-03-26 (×2): qty 100
  Filled 2022-03-26: qty 80

## 2022-03-26 MED ORDER — LORAZEPAM 2 MG/ML IJ SOLN: 0.0000 mg | Freq: Two times a day (BID) | INTRAMUSCULAR | Status: DC

## 2022-03-26 MED ORDER — SODIUM CHLORIDE 0.9 % IV BOLUS: 1000.0000 mL | Freq: Once | INTRAVENOUS | Status: DC

## 2022-03-26 MED ORDER — AMLODIPINE BESYLATE 5 MG PO TABS
5.0000 mg | ORAL_TABLET | Freq: Every day | ORAL | Status: DC
  Administered 2022-03-27 – 2022-03-29 (×3): 5 mg via ORAL
  Filled 2022-03-26 (×3): qty 1

## 2022-03-26 MED ORDER — ACETAMINOPHEN 650 MG RE SUPP: 650.0000 mg | Freq: Four times a day (QID) | RECTAL | Status: DC | PRN

## 2022-03-26 MED ORDER — THIAMINE HCL 100 MG PO TABS
100.0000 mg | ORAL_TABLET | Freq: Every day | ORAL | Status: DC
  Administered 2022-03-28 – 2022-03-29 (×2): 100 mg via ORAL
  Filled 2022-03-26 (×2): qty 1

## 2022-03-26 MED ORDER — PREGABALIN 100 MG PO CAPS
100.0000 mg | ORAL_CAPSULE | Freq: Two times a day (BID) | ORAL | Status: DC
  Administered 2022-03-26 – 2022-03-29 (×6): 100 mg via ORAL
  Filled 2022-03-26 (×6): qty 1

## 2022-03-26 MED ORDER — OCTREOTIDE ACETATE 100 MCG/ML IJ SOLN
50.0000 ug | Freq: Once | INTRAMUSCULAR | Status: AC
  Administered 2022-03-26: 50 ug via INTRAVENOUS
  Filled 2022-03-26: qty 1

## 2022-03-26 MED ORDER — SODIUM CHLORIDE 0.9% FLUSH
3.0000 mL | Freq: Two times a day (BID) | INTRAVENOUS | Status: DC
  Administered 2022-03-26 – 2022-03-27 (×3): 3 mL via INTRAVENOUS

## 2022-03-26 MED ORDER — CEFTRIAXONE SODIUM 1 G IJ SOLR
1.0000 g | Freq: Once | INTRAMUSCULAR | Status: AC
  Administered 2022-03-26: 1 g via INTRAVENOUS
  Filled 2022-03-26: qty 10

## 2022-03-26 MED ORDER — SODIUM CHLORIDE 0.9 % IV BOLUS
500.0000 mL | Freq: Once | INTRAVENOUS | Status: AC
  Administered 2022-03-26: 500 mL via INTRAVENOUS

## 2022-03-26 MED ORDER — PANTOPRAZOLE SODIUM 40 MG PO TBEC: 40.0000 mg | DELAYED_RELEASE_TABLET | Freq: Every day | ORAL | 0 refills | Status: DC

## 2022-03-26 MED ORDER — THIAMINE HCL 100 MG/ML IJ SOLN
100.0000 mg | Freq: Every day | INTRAMUSCULAR | Status: DC
  Administered 2022-03-26 – 2022-03-27 (×2): 100 mg via INTRAVENOUS
  Filled 2022-03-26: qty 2

## 2022-03-26 MED ORDER — KETOROLAC TROMETHAMINE 0.5 % OP SOLN: 1.0000 [drp] | Freq: Four times a day (QID) | OPHTHALMIC | Status: DC | PRN

## 2022-03-26 MED ORDER — LORAZEPAM 2 MG/ML IJ SOLN: 0.0000 mg | Freq: Four times a day (QID) | INTRAMUSCULAR | Status: AC

## 2022-03-26 MED ORDER — ORAL CARE MOUTH RINSE
15.0000 mL | Freq: Two times a day (BID) | OROMUCOSAL | Status: DC
  Administered 2022-03-26 – 2022-03-29 (×4): 15 mL via OROMUCOSAL

## 2022-03-26 MED ORDER — SODIUM CHLORIDE 0.9% IV SOLUTION: Freq: Once | INTRAVENOUS | Status: DC

## 2022-03-26 NOTE — ED Provider Notes (Signed)
[facility] EMERGENCY DEPARTMENT Provider Note   CSN: [id number] Arrival date & time: 03/26/22  1143     History  Chief Complaint  Patient presents with  . Emesis  . Diarrhea    Alice Riley is a 61 y.o. female with a past medical history of hypertension, hyperlipidemia and GI bleed secondary to PUD in March presenting with hematemesis and melena started this morning.  She says that this started out of nowhere.  Did not eat anything different yesterday.  Endorses 5-6 liquor or beer drinks on Saturdays and Sundays and 2-3 on weekdays.  No longer uses NSAIDs due to GI bleed but takes Tylenol as needed.  Was [facility] last month but did not.  She was placed on Protonix twice daily on discharge from the hospital and no longer takes this.   Emesis Associated symptoms: diarrhea   Associated symptoms: no abdominal pain, no chills and no fever   Diarrhea Associated symptoms: vomiting   Associated symptoms: no abdominal pain, no chills and no fever       Home Medications Prior to Admission medications   Medication Sig Start Date End Date Taking? Authorizing Provider  amLODipine (NORVASC) 5 MG tablet Take 5 mg by mouth daily. 04/01/20   [provider]  atorvastatin (LIPITOR) 80 MG tablet Take by mouth. 09/26/18   [provider]  cephALEXin (KEFLEX) 500 MG capsule Take 1 capsule (500 mg total) by mouth 2 (two) times daily. 08/08/20   Molpus, Jonny RuizJohn, MD  ciprofloxacin-dexamethasone [facility](CIPRODEX) OTIC suspension  06/13/20   [provider]  diclofenac Sodium (VOLTAREN) 1 % GEL Apply topically. 07/22/20   [provider]  fenofibrate 54 MG tablet  07/15/20   [provider]  hydrochlorothiazide (HYDRODIURIL) 25 MG tablet Take by mouth. 06/16/17   [provider]  lisinopril (PRINIVIL,ZESTRIL) 20 MG tablet Take 20 mg by mouth daily. 12/18/18   [provider]  lisinopril (ZESTRIL) 10 MG  tablet Take 10 mg by mouth daily. 05/30/20   [provider]  olopatadine (PATANOL) 0.1 % ophthalmic solution PLACE 1 DROP INTO BOTH EYES 2 (TWO) TIMES DAILY AS NEEDED FOR ALLERGIES. 06/03/20   [provider]  ondansetron (ZOFRAN) 4 MG tablet Take 1 tablet (4 mg total) by mouth every 8 (eight) hours as needed for nausea. 08/14/13   Trixie DredgeWest, Emily, PA-C  pregabalin (LYRICA) 100 MG capsule Take 100 mg by mouth 2 (two) times daily. 11/11/18   [provider]  valACYclovir (VALTREX) 1000 MG tablet Take 1,000 mg by mouth daily. 05/26/20   [provider]      Allergies    Patient has no known allergies.    Review of Systems   Review of Systems  Constitutional:  Positive for fatigue. Negative for chills and fever.  Respiratory:  Negative for shortness of breath.   Cardiovascular:  Negative for palpitations.  Gastrointestinal:  Positive for blood in stool, diarrhea and vomiting. Negative for abdominal pain.  Genitourinary:  Positive for dysuria.  Neurological:  Positive for weakness. Negative for dizziness.   Physical Exam Updated Vital Signs BP (!) 151/95   Pulse 82   Temp 99.1 F (37.3 C)   Resp 18   Ht 5\' 6"  (1.676 m)   Wt 59.9 kg   LMP 10/01/2009   SpO2 100%   BMI 21.31 kg/m  Physical Exam Vitals and nursing note reviewed.  Constitutional:      Appearance: Normal appearance.  HENT:  Head: Normocephalic and atraumatic.  Eyes:     General: No scleral icterus.    Conjunctiva/sclera: Conjunctivae normal.  Pulmonary:     Effort: Pulmonary effort is normal. No respiratory distress.  Abdominal:     General: Abdomen is flat.     Palpations: Abdomen is soft.     Tenderness: There is abdominal tenderness (Epigastric).  Skin:    Findings: No rash.  Neurological:     Mental Status: She is alert.  Psychiatric:        Mood and Affect: Mood normal.    ED Results / Procedures / Treatments   Labs (all labs ordered are listed, but only abnormal  results are displayed) Labs Reviewed  CBC WITH DIFFERENTIAL/PLATELET - Abnormal; Notable for the following components:      Result Value   RBC 2.26 (*)    Hemoglobin 7.0 (*)    HCT 19.5 (*)    RDW 21.9 (*)    Platelets 148 (*)    All other components within normal limits  COMPREHENSIVE METABOLIC PANEL - Abnormal; Notable for the following components:   Total Bilirubin 1.5 (*)    All other components within normal limits  URINALYSIS, ROUTINE W REFLEX MICROSCOPIC - Abnormal; Notable for the following components:   APPearance HAZY (*)    Hgb urine dipstick MODERATE (*)    Ketones, ur 40 (*)    Protein, ur 30 (*)    Nitrite POSITIVE (*)    Leukocytes,Ua SMALL (*)    All other components within normal limits  OCCULT BLOOD X 1 CARD TO LAB, STOOL - Abnormal; Notable for the following components:   Fecal Occult Bld POSITIVE (*)    All other components within normal limits  URINALYSIS, MICROSCOPIC (REFLEX) - Abnormal; Notable for the following components:   Bacteria, UA MANY (*)    Non Squamous Epithelial PRESENT (*)    All other components within normal limits  LIPASE, BLOOD  PROTIME-INR    EKG None  Radiology No results found.  Procedures .Critical Care Performed by: Rhae Hammock, PA-C Authorized by: Rhae Hammock, PA-C   Critical care provider statement:    Critical care time (minutes):  90   Critical care time was exclusive of:  Separately billable procedures and treating other patients   Critical care was necessary to treat or prevent imminent or life-threatening deterioration of the following conditions:  Circulatory failure and shock   Critical care was time spent personally by me on the following activities:  Blood draw for specimens, development of treatment plan with patient or surrogate, discussions with consultants, evaluation of patient's response to treatment, examination of patient, obtaining history from patient or surrogate, ordering and performing  treatments and interventions, ordering and review of laboratory studies, re-evaluation of patient's condition and review of old charts   I assumed direction of critical care for this patient from another provider in my specialty: no     Care discussed with: admitting provider and accepting provider at another facility     Medications Ordered in ED Medications - No data to display  ED Course/ Medical Decision Making/ A&P Clinical Course as of 03/26/22 1714  Thu Mar 26, 2022  1408 Occult blood card to lab, stool(!) [MR]  1408 Fecal Occult Blood, POC(!): POSITIVE [MR]  1409 Hemoglobin(!): 7.0 [MR]  1409 Nitrite(!): POSITIVE [MR]  1409 Bacteria, UA(!): MANY [MR]  1409 WBC, UA: >50 [MR]  1714 Reports another episode of melena in the dept [MR]  Clinical Course User Index [MR] Yvonne Petite, Cecilio Asper, PA-C                           Medical Decision Making Amount and/or Complexity of Data Reviewed Labs: ordered. Decision-making details documented in ED Course.  Risk OTC drugs. Prescription drug management. Decision regarding hospitalization.   This patient presents to the ED for concern of melena and hematemesis.  Differential includes but is not limited to Mallory-Weiss, gastritis, PUD, IBS/IBD, colorectal cancer, polyps, esophagitis   This is not an exhaustive differential.    Past Medical History / Co-morbidities / Social History: Alcohol use disorder and hypertension   Additional history: Additional history obtained from external chart review.  Patient was admitted at [facility] from 3/13 to 3/14 due to an acute GI bleed.  Patient's EGD at that time had a 6 mm round ulcer in the prepyloric region with a clean base.  Gastric antrum also had erosions.  Also had a Schatzki ring.  She was prescribed 40 mg pantoprazole for 8 weeks and when finished with this course she was to follow-up for another EGD.  She says that she did not follow through with this follow-up and needs to  reschedule.  Michela Pitcher that she had a colonoscopy in 2017 that was negative.   Physical Exam: Physical exam performed. The pertinent findings include: Positive Hemoccult  Lab Tests: I ordered, and personally interpreted labs.  The pertinent results include:  -Positive Hemoccult -Hemoglobin 7 -Nitrite and leukocyte positive bacteria    Medications: I ordered medication including pantoprazole, octreotide, Rocephin and fluids.  Also put on CIWA precautions reevaluation of the patient after these medicines showed that the patient improved. I have reviewed the patients home medicines and have made adjustments as needed.   Consultations Obtained: I requested consultation with Dr. Alessandra Bevels with [facility] who agreed to see the patient during her hospitalization.   Disposition: Patient requires admission for blood transfusion in the setting of active GI bleed.  Will be transferred to [facility] with MD. Algis Liming who recommends pantoprazole infusion and IVF.  She will need type and screen on arrival to [facility] as well as consent for infusion.   I discussed this case with my attending physician Dr. Pearline Cables who cosigned this note including patient's presenting symptoms, physical exam, and planned diagnostics and interventions. Attending physician stated agreement with plan or made changes to plan which were implemented.     Final Clinical Impression(s) / ED Diagnoses Final diagnoses:  Gastrointestinal hemorrhage associated with gastric ulcer    Rx / DC Orders Admit to Dr. Algis Liming at [facility].    5:00p: Patient ambulated to the restroom, reports that she had another episode of black diarrhea.  Escorted back to her bed.   Rhae Hammock, PA-C 03/26/22 1714    Jeanell Sparrow, DO 03/28/22 1113

## 2022-03-26 NOTE — ED Notes (Signed)
Attempted to call report to [facility] ICU stepdown. No answer.

## 2022-03-26 NOTE — Progress Notes (Signed)
TRH transfer acceptance note  Transferring provider: Georgeanna Lea, ED PA-C Transferring facility: [facility] ED Accepting facility: [facility] stepdown unit Accepting status: Inpatient.  61 year old female, medical history significant for alcohol use disorder, prior history of GI bleed in March 2023 when she was hospitalized in [facility] regional hospital from 3/13 - 3/14, EGD had shown prepyloric region ulcer with clean base, gastric antrum erosions, completed 8 weeks course of Protonix but failed to follow-up with GI for repeat EGD, no NSAID use, HTN, HLD, presented to ED with 3 episodes of vomiting black-colored material and 3 watery black stools. Mildly hypertensive on arrival but other vital signs stable.  Lab work shows unremarkable CMP, hemoglobin 7 down from 10 g on 3/11, platelets 148.  Patient has been given IV Protonix 40 mg x 1, IV octreotide 50 mcg x 1, initiated on CIWA protocol and thiamine.  Case was discussed by EDP with Dr. Fulton Mole GI who will see in consult.  Plan: Excepted to stepdown bed as inpatient.  [facility] verified availability of same bed. Advised EDP to initiate IV fluids, IV PPI infusion, keep patient n.p.o. except meds. As per EDP, they only have 1 unit of blood saved for emergencies and hence no blood to transfuse this patient.  They also do not have the capability to do type and screen at [facility] ED. Will need evaluation by admitting provider, [facility] to consult, PRBC transfusion as soon as she gets here.   Marcellus Scott, MD,  FACP, [facility], [facility], [facility] (Care Management Physician Certified) [facility] Advisor [place]  To contact the attending provider between 7A-7P or the covering provider during after hours 7P-7A, please log into the web site [email or web address] and access using universal [place] password for that web site. If you do not have the password, please call the hospital operator.

## 2022-03-26 NOTE — H&P (Signed)
History and Physical   [name] [id number] DOB: [birth year] DOA: 03/26/2022  PCP: [facility] [place]   Patient coming from: Home  Chief Complaint: GI bleed  HPI: Alice Riley is a 61 y.o. female with medical history significant of hypertension, anemia, hyperlipidemia, neuropathy, GI bleed, alcohol use presenting with hematemesis and melena.  Patient states she had onset of melena and hematemesis this morning which started suddenly.  No change in diet.  She does continue use alcohol, 2-3 drinks a day on weekdays and 5-6 drinks a day on weekends.  She denies NSAID use.  In March she was admitted to [facility] regional for GI bleed had EGD showing pyloric ulcer and gastric erosions, completed 8-week course of PPI but did not follow-up outpatient and is no longer on this medication.  She denies fevers, chills, chest pain, shortness of breath, abdominal pain   ED Course: Vital signs in the ED significant for blood pressure in the Q000111Q to Q000111Q systolic.  Lab work-up included CMP with T. bili of 1.5.  CBC with hemoglobin of 7 down from previous of 10, platelets 148.  PT and INR within normal limits.  Lipase normal.  FOBT positive.  MRSA screen pending.  Urinalysis with hemoglobin, protein, nitrate, leukocytes, bacteria.  Patient received ceftriaxone, PPI placed on a drip, octreotide, 1.5 L of fluid started on a rate, thiamine, as needed Ativan in the ED.  Review of Systems: As per HPI otherwise all other systems reviewed and are negative.  Past Medical History:  Diagnosis Date   Hypertension    Peripheral neuropathy    Scoliosis     Past Surgical History:  Procedure Laterality Date   BACK SURGERY     CHOLECYSTECTOMY      Social History  reports that she has never smoked. She has never used smokeless tobacco. She reports current alcohol use. She reports that she does not use drugs.  Allergies  Allergen Reactions   Shellfish Allergy Itching and Rash    Family History   Problem Relation Age of Onset   Hypertension Mother    Hypertension Father    Diabetes Father   Reviewed on admission  Prior to Admission medications   Medication Sig Start Date End Date Taking? Authorizing Provider  pantoprazole (PROTONIX) 40 MG tablet Take 1 tablet (40 mg total) by mouth daily. 03/26/22  Yes Redwine, Madison A, PA-C  amLODipine (NORVASC) 5 MG tablet Take 5 mg by mouth daily. 04/01/20   [provider]  atorvastatin (LIPITOR) 80 MG tablet Take by mouth. 09/26/18   [provider]  ciprofloxacin-dexamethasone (CIPRODEX) OTIC suspension  06/13/20   [provider]  diclofenac Sodium (VOLTAREN) 1 % GEL Apply topically. 07/22/20   [provider]  fenofibrate 54 MG tablet  07/15/20   [provider]  hydrochlorothiazide (HYDRODIURIL) 25 MG tablet Take by mouth. 06/16/17   [provider]  lisinopril (PRINIVIL,ZESTRIL) 20 MG tablet Take 20 mg by mouth daily. 12/18/18   [provider]  lisinopril (ZESTRIL) 10 MG tablet Take 10 mg by mouth daily. 05/30/20   [provider]  olopatadine (PATANOL) 0.1 % ophthalmic solution PLACE 1 DROP INTO BOTH EYES 2 (TWO) TIMES DAILY AS NEEDED FOR ALLERGIES. 06/03/20   [provider]  ondansetron (ZOFRAN) 4 MG tablet Take 1 tablet (4 mg total) by mouth every 8 (eight) hours as needed for nausea. 08/14/13   Clayton Bibles, PA-C  pregabalin (LYRICA) 100 MG capsule Take 100 mg by mouth 2 (two)  times daily. 11/11/18   [provider]  valACYclovir (VALTREX) 1000 MG tablet Take 1,000 mg by mouth daily. 05/26/20   [provider]    Physical Exam: Vitals:   03/26/22 1700 03/26/22 1800 03/26/22 1900 03/26/22 1932  BP: (!) 150/93 (!) 148/109 (!) 151/91 (!) 151/91  Pulse: 70 73 67 67  Resp: 18 (!) 31 15 15   Temp:  98.8 F (37.1 C)    TempSrc:  Oral    SpO2: 100% 100% 100% (!) 10%  Weight:      Height:        Physical Exam Constitutional:      General: She is  not in acute distress.    Appearance: Normal appearance.  HENT:     Head: Normocephalic and atraumatic.     Mouth/Throat:     Mouth: Mucous membranes are moist.     Pharynx: Oropharynx is clear.  Eyes:     Extraocular Movements: Extraocular movements intact.     Pupils: Pupils are equal, round, and reactive to light.  Cardiovascular:     Rate and Rhythm: Normal rate and regular rhythm.     Pulses: Normal pulses.     Heart sounds: Normal heart sounds.  Pulmonary:     Effort: Pulmonary effort is normal. No respiratory distress.     Breath sounds: Normal breath sounds.  Abdominal:     General: Bowel sounds are normal. There is no distension.     Palpations: Abdomen is soft.     Tenderness: There is no abdominal tenderness.  Musculoskeletal:        General: No swelling or deformity.  Skin:    General: Skin is warm and dry.  Neurological:     General: No focal deficit present.     Mental Status: Mental status is at baseline.    Labs on Admission: I have personally reviewed following labs and imaging studies  CBC: Recent Labs  Lab 03/26/22 1250 03/26/22 1848  WBC 6.7 5.1  NEUTROABS 5.6  --   HGB 7.0* 7.2*  HCT 19.5* 20.6*  MCV 86.3 89.2  PLT 148* 132*    Basic Metabolic Panel: Recent Labs  Lab 03/26/22 1250  NA 137  K 3.7  CL 103  CO2 22  GLUCOSE 90  BUN 8  CREATININE 0.52  CALCIUM 9.0    GFR: Estimated Creatinine Clearance: 71.8 mL/min (by C-G formula based on SCr of 0.52 mg/dL).  Liver Function Tests: Recent Labs  Lab 03/26/22 1250  AST 26  ALT 13  ALKPHOS 42  BILITOT 1.5*  PROT 7.2  ALBUMIN 4.2    Urine analysis:    Component Value Date/Time   COLORURINE YELLOW 03/26/2022 1325   APPEARANCEUR HAZY (A) 03/26/2022 1325   LABSPEC 1.020 03/26/2022 1325   PHURINE 7.5 03/26/2022 1325   GLUCOSEU NEGATIVE 03/26/2022 1325   HGBUR MODERATE (A) 03/26/2022 1325   BILIRUBINUR NEGATIVE 03/26/2022 1325   KETONESUR 40 (A) 03/26/2022 1325   PROTEINUR 30  (A) 03/26/2022 1325   UROBILINOGEN 1.0 08/14/2013 0959   NITRITE POSITIVE (A) 03/26/2022 1325   LEUKOCYTESUR SMALL (A) 03/26/2022 1325    Radiological Exams on Admission: No results found.  EKG: Not performed in the emergency department  Assessment/Plan Principal Problem:   Acute upper GI bleed Active Problems:   Essential hypertension   Mixed hyperlipidemia   Neuropathic pain   Alcohol use   Acute GI bleed > Patient presenting with acute onset of hematemesis and melena this morning. >  This is in setting of alcohol use of 2-3 drinks a day on weekdays and 5-6 drinks a day on weekends as below. > Denies NSAID use.  Did have prior GI bleed in March and was admitted to [facility] regional with ulceration and gastric erosions noted completed outpatient PPI course but did not follow-up in GI clinic is now off PPI. > Hemoglobin noted to be down to 7 from baseline of 10.  FOBT positive. > Given a dose of octreotide and started on PPI drip in the ED. > GI is consulted and will see the patient - Appreciate GI recommendations - Monitor on stepdown unit - Type and screen and transfuse 1 unit now - Repeat CBC now to confirm trend, continue to trend CBC following this - Continue with PPI drip - N.p.o.  Alcohol use > Significant alcohol use of 2-3 drinks a day on weekdays and 5 6 drinks a day on weekends. > No history of withdrawal - Continue with CIWA with Ativan - Continue with vitamin supplementation when tolerating p.o. - Supportive care  Hypertension - Continue home amlodipine - Hold home hydrochlorothiazide and lisinopril considering active bleed and possible need for blood pressure support unless patient becomes significantly hypertensive  Neuropathy - Continue home Lyrica  Hyperlipidemia - Continue home atorvastatin  DVT prophylaxis: SCDs Code Status:   Full Family Communication:  Updated at bedside  Disposition Plan:   Patient is from:  Home  Anticipated DC  to:  Home  Anticipated DC date:  2 to 5 days  Anticipated DC barriers: None  Consults called:  GI, consulted in the ED, will see the patient Admission status:  Inpatient, stepdown  Severity of Illness: The appropriate patient status for this patient is INPATIENT. Inpatient status is judged to be reasonable and necessary in order to provide the required intensity of service to ensure the patient's safety. The patient's presenting symptoms, physical exam findings, and initial radiographic and laboratory data in the context of their chronic comorbidities is felt to place them at high risk for further clinical deterioration. Furthermore, it is not anticipated that the patient will be medically stable for discharge from the hospital within 2 midnights of admission.   * I certify that at the point of admission it is my clinical judgment that the patient will require inpatient hospital care spanning beyond 2 midnights from the point of admission due to high intensity of service, high risk for further deterioration and high frequency of surveillance required.Marcelyn Bruins MD [facility]  How to contact the [facility] Attending or Consulting provider [place] or covering provider during after hours [place], for this patient?   Check the care team in [facility] and look for a) attending/consulting TRH provider listed and b) the [facility] team listed Log into [email or web address] and use [place]'s universal password to access. If you do not have the password, please contact the hospital operator. Locate the [facility] provider you are looking for under [facility] and page to a number that you can be directly reached. If you still have difficulty reaching the provider, please page the [facility] (Director on Call) for the Hospitalists listed on amion for assistance.  03/26/2022, 8:09 PM

## 2022-03-26 NOTE — ED Notes (Signed)
ED Provider at bedside. 

## 2022-03-26 NOTE — Discharge Instructions (Addendum)
I have refilled the pantoprazole.  Please take this daily twice a day .  Also, it is important for you to call the GI office that you saw in March.  You will need a repeat endoscopy.  The best thing you can do for yourself at this time is to stop drinking alcohol.  It is the cause of this GI bleeding.  Continue to avoid ibuprofen and naproxen and read more information about ulcers and the information on these papers   Start taking ferrous sulfate 6/10

## 2022-03-26 NOTE — ED Triage Notes (Signed)
Reports diarrhea and emesis started today , she reports weakness , chills . Adds her emesis was "black"

## 2022-03-27 DIAGNOSIS — K922 Gastrointestinal hemorrhage, unspecified: Secondary | ICD-10-CM | POA: Diagnosis not present

## 2022-03-27 LAB — TYPE AND SCREEN
ABO/RH(D): A POS
Antibody Screen: NEGATIVE
Unit division: 0

## 2022-03-27 LAB — CBC
HCT: 23.6 % — ABNORMAL LOW (ref 36.0–46.0)
HCT: 28.1 % — ABNORMAL LOW (ref 36.0–46.0)
Hemoglobin: 8.1 g/dL — ABNORMAL LOW (ref 12.0–15.0)
Hemoglobin: 9.7 g/dL — ABNORMAL LOW (ref 12.0–15.0)
MCH: 29.9 pg (ref 26.0–34.0)
MCH: 30.4 pg (ref 26.0–34.0)
MCHC: 34.3 g/dL (ref 30.0–36.0)
MCHC: 34.5 g/dL (ref 30.0–36.0)
MCV: 87.1 fL (ref 80.0–100.0)
MCV: 88.1 fL (ref 80.0–100.0)
Platelets: 119 10*3/uL — ABNORMAL LOW (ref 150–400)
Platelets: 146 10*3/uL — ABNORMAL LOW (ref 150–400)
RBC: 2.71 MIL/uL — ABNORMAL LOW (ref 3.87–5.11)
RBC: 3.19 MIL/uL — ABNORMAL LOW (ref 3.87–5.11)
RDW: 19.7 % — ABNORMAL HIGH (ref 11.5–15.5)
RDW: 20.5 % — ABNORMAL HIGH (ref 11.5–15.5)
WBC: 4.1 10*3/uL (ref 4.0–10.5)
WBC: 6.6 10*3/uL (ref 4.0–10.5)
nRBC: 0 % (ref 0.0–0.2)
nRBC: 0.3 % — ABNORMAL HIGH (ref 0.0–0.2)

## 2022-03-27 LAB — BPAM RBC
Blood Product Expiration Date: 202306262359
ISSUE DATE / TIME: 202306012050
Unit Type and Rh: 6200

## 2022-03-27 LAB — HEMOGLOBIN AND HEMATOCRIT, BLOOD
HCT: 23.2 % — ABNORMAL LOW (ref 36.0–46.0)
HCT: 30.7 % — ABNORMAL LOW (ref 36.0–46.0)
Hemoglobin: 8.1 g/dL — ABNORMAL LOW (ref 12.0–15.0)
Hemoglobin: 10.5 g/dL — ABNORMAL LOW (ref 12.0–15.0)

## 2022-03-27 LAB — COMPREHENSIVE METABOLIC PANEL
ALT: 12 U/L (ref 0–44)
AST: 18 U/L (ref 15–41)
Albumin: 3.5 g/dL (ref 3.5–5.0)
Alkaline Phosphatase: 35 U/L — ABNORMAL LOW (ref 38–126)
Anion gap: 10 (ref 5–15)
BUN: 8 mg/dL (ref 6–20)
CO2: 21 mmol/L — ABNORMAL LOW (ref 22–32)
Calcium: 8.2 mg/dL — ABNORMAL LOW (ref 8.9–10.3)
Chloride: 108 mmol/L (ref 98–111)
Creatinine, Ser: 0.59 mg/dL (ref 0.44–1.00)
GFR, Estimated: >60 mL/min (ref >60)
Glucose, Bld: 85 mg/dL (ref 70–99)
Potassium: 3.4 mmol/L — ABNORMAL LOW (ref 3.5–5.1)
Sodium: 139 mmol/L (ref 135–145)
Total Bilirubin: 1.3 mg/dL — ABNORMAL HIGH (ref 0.3–1.2)
Total Protein: 6.1 g/dL — ABNORMAL LOW (ref 6.5–8.1)

## 2022-03-27 MED ORDER — HYDRALAZINE HCL 20 MG/ML IJ SOLN
10.0000 mg | Freq: Four times a day (QID) | INTRAMUSCULAR | Status: DC | PRN
  Administered 2022-03-27 – 2022-03-28 (×2): 10 mg via INTRAVENOUS
  Filled 2022-03-27 (×2): qty 1

## 2022-03-27 MED ORDER — POTASSIUM CHLORIDE 10 MEQ/100ML IV SOLN
10.0000 meq | INTRAVENOUS | Status: AC
  Administered 2022-03-27 (×2): 10 meq via INTRAVENOUS
  Filled 2022-03-27 (×2): qty 100

## 2022-03-27 NOTE — Consult Note (Signed)
Referring Provider: [facility] Primary Care Physician:  [facility] [place] Primary Gastroenterologist:  Althia Forts ([facility])  Reason for Consultation:  Upper GI bleed  HPI: Alice Riley is a 61 y.o. female medical history significant of hypertension, anemia, hyperlipidemia, neuropathy, GI bleed, alcohol use presents for evaluation of melena and hematemesis.  Patient was admitted to [facility] regional 12/2021 for GI bleed. EGD showed pyloric ulcer and gastric erosions. Patient completed 8 week course of PPI but was lost to follow up and is no longer on medication.  Patient states yesterday (6/1) she began having diarrhea that was black. Had about 5 episodes of melena. Then she had one episode of hematemesis that was also black (no further emesis at this time). States when she was admitted back in march she only had melena, has not had hematemesis before. Reports alcohol use (beer and liquor) 3 drinks on week days and 5 drinks on weekends. Denies NSAID use. Previously used NSAIDs until her GI provider recommended stopping them due to ulcers. Denies tobacco use. Reports "sore" abdomen. Denies family history of colon cancer or GI issues. Denies blood thinner use.  Screening colonoscopy 01/2016 with [facility]. Report unavailable. Showed grade 2 internal hemorrhoids.  Past Medical History:  Diagnosis Date   Hypertension    Peripheral neuropathy    Scoliosis     Past Surgical History:  Procedure Laterality Date   BACK SURGERY     CHOLECYSTECTOMY      Prior to Admission medications   Medication Sig Start Date End Date Taking? Authorizing Provider  acetaminophen (TYLENOL) 500 MG tablet Take 500 mg by mouth every 6 (six) hours as needed for moderate pain.   Yes [provider]  amLODipine (NORVASC) 5 MG tablet Take 5 mg by mouth daily. 04/01/20  Yes [provider]  atorvastatin (LIPITOR) 80 MG tablet Take 80 mg by mouth daily. 09/26/18  Yes [provider]  ciprofloxacin-dexamethasone  (CIPRODEX) OTIC suspension Place 4 drops into both ears 2 (two) times daily as needed (ear pain). 06/13/20  Yes [provider]  hydrochlorothiazide (HYDRODIURIL) 12.5 MG tablet Take 12.5 mg by mouth daily.   Yes [provider]  ketorolac (ACULAR) 0.5 % ophthalmic solution Place 1 drop into the left eye 4 (four) times daily as needed (eye irritation). 03/10/22  Yes [provider]  lisinopril (ZESTRIL) 10 MG tablet Take 10 mg by mouth daily. 05/30/20  Yes [provider]  omeprazole (PRILOSEC) 40 MG capsule Take 40 mg by mouth daily. 01/12/22  Yes [provider]  pantoprazole (PROTONIX) 40 MG tablet Take 1 tablet (40 mg total) by mouth daily. 03/26/22  Yes Redwine, Madison A, PA-C  pregabalin (LYRICA) 100 MG capsule Take 100 mg by mouth 2 (two) times daily. 11/11/18  Yes [provider]  ondansetron (ZOFRAN) 4 MG tablet Take 1 tablet (4 mg total) by mouth every 8 (eight) hours as needed for nausea. Patient not taking: Reported on 03/26/2022 08/14/13   Clayton Bibles, PA-C    Scheduled Meds:  sodium chloride   Intravenous Once   amLODipine  5 mg Oral Daily   atorvastatin  80 mg Oral Daily   Chlorhexidine Gluconate Cloth  6 each Topical Daily   LORazepam  0-4 mg Intravenous Q6H   Or   LORazepam  0-4 mg Oral Q6H   [START ON 03/28/2022] LORazepam  0-4 mg Intravenous Q12H   Or   [START ON 03/28/2022] LORazepam  0-4 mg Oral Q12H   mouth rinse  15 mL Mouth  Rinse BID   pregabalin  100 mg Oral BID   sodium chloride flush  3 mL Intravenous Q12H   thiamine  100 mg Oral Daily   Or   thiamine  100 mg Intravenous Daily   Continuous Infusions:  sodium chloride     And   sodium chloride 100 mL/hr at 03/27/22 0804   cefTRIAXone (ROCEPHIN)  IV     pantoprazole 8 mg/hr (03/27/22 0841)   PRN Meds:.acetaminophen **OR** acetaminophen, ketorolac  Allergies as of 03/26/2022 - Review Complete 03/26/2022  Allergen Reaction Noted   Shellfish allergy Itching and  Rash 03/26/2022    Family History  Problem Relation Age of Onset   Hypertension Mother    Hypertension Father    Diabetes Father     Social History   Socioeconomic History   Marital status: Single    Spouse name: Not on file   Number of children: Not on file   Years of education: Not on file   Highest education level: Not on file  Occupational History   Not on file  Tobacco Use   Smoking status: Never   Smokeless tobacco: Never  Vaping Use   Vaping Use: Never used  Substance and Sexual Activity   Alcohol use: Yes    Comment: occ   Drug use: No   Sexual activity: Not on file  Other Topics Concern   Not on file  Social History Narrative   ** Merged History Encounter **       Social Determinants of Health   Financial Resource Strain: Not on file  Food Insecurity: Not on file  Transportation Needs: Not on file  Physical Activity: Not on file  Stress: Not on file  Social Connections: Not on file  Intimate Partner Violence: Not on file    Review of Systems: Review of Systems  Constitutional:  Negative for chills, fever and weight loss.  HENT:  Negative for hearing loss and tinnitus.   Eyes:  Negative for blurred vision and double vision.  Respiratory:  Negative for cough and hemoptysis.   Cardiovascular:  Negative for chest pain and palpitations.  Gastrointestinal:  Positive for abdominal pain, diarrhea, melena, nausea and vomiting. Negative for blood in stool, constipation and heartburn.  Genitourinary:  Negative for dysuria and urgency.  Musculoskeletal:  Negative for myalgias and neck pain.  Skin:  Negative for itching and rash.  Neurological:  Negative for seizures and loss of consciousness.  Psychiatric/Behavioral:  Negative for depression and suicidal ideas.     Physical Exam:Physical Exam Constitutional:      Appearance: Normal appearance.  HENT:     Head: Normocephalic and atraumatic.     Nose: Nose normal. No congestion.     Mouth/Throat:      Mouth: Mucous membranes are moist.     Pharynx: Oropharynx is clear.  Eyes:     Extraocular Movements: Extraocular movements intact.     Comments: Conjunctival pallor  Cardiovascular:     Rate and Rhythm: Normal rate and regular rhythm.  Pulmonary:     Effort: Pulmonary effort is normal. No respiratory distress.  Abdominal:     General: Abdomen is flat. Bowel sounds are normal. There is no distension.     Palpations: Abdomen is soft. There is no mass.     Tenderness: There is no abdominal tenderness. There is no guarding or rebound.     Hernia: No hernia is present.  Musculoskeletal:        General: No swelling. Normal  range of motion.     Cervical back: Normal range of motion and neck supple.  Skin:    General: Skin is warm and dry.     Coloration: Skin is not jaundiced.  Neurological:     General: No focal deficit present.     Mental Status: She is alert and oriented to person, place, and time.  Psychiatric:        Mood and Affect: Mood normal.        Behavior: Behavior normal.        Thought Content: Thought content normal.        Judgment: Judgment normal.    Vital signs: Vitals:   03/27/22 0700 03/27/22 0800  BP: (!) 164/94 (!) 172/92  Pulse: (!) 57 63  Resp:  13  Temp:  98.4 F (36.9 C)  SpO2:  100%   Last BM Date : 03/26/22    GI:  Lab Results: Recent Labs    03/26/22 1250 03/26/22 1848 03/27/22 0242  WBC 6.7 5.1 4.1  HGB 7.0* 7.2* 8.1*  8.1*  HCT 19.5* 20.6* 23.6*  23.2*  PLT 148* 132* 119*   BMET Recent Labs    03/26/22 1250 03/27/22 0242  NA 137 139  K 3.7 3.4*  CL 103 108  CO2 22 21*  GLUCOSE 90 85  BUN 8 8  CREATININE 0.52 0.59  CALCIUM 9.0 8.2*   LFT Recent Labs    03/27/22 0242  PROT 6.1*  ALBUMIN 3.5  AST 18  ALT 12  ALKPHOS 35*  BILITOT 1.3*   PT/INR Recent Labs    03/26/22 1250  LABPROT 14.5  INR 1.1     Studies/Results: No results found.  Impression: Upper GI bleed - hgb 8.1, s/p 1 unit pRBCs (7.0 on  arrival - baseline is 10) -Platelets 119 -BUN 8, creatinine 0.59 - positive fecal occult  Alcohol use - CIWA protocol - AST 18/ ALT 12/ Alk phos 35 - T. Bili 1.3  Plan: Plan for EGD tomorrow. I thoroughly discussed the procedures to include nature, alternatives, benefits, and risks including but not limited to bleeding, perforation, infection, anesthesia/cardiac and pulmonary complications. Patient provides understanding and gave verbal consent to proceed. Continue Protonix drip Continue clear liquid diet NPO at midnight Continue anti-emetics and supportive care as needed. [facility] will follow.      LOS: 1 day   Garnette Scheuermann  PA-C 03/27/2022, 9:13 AM  Contact #  [phone]

## 2022-03-27 NOTE — Progress Notes (Signed)
Transition of Care [facility]) Screening Note  Patient Details  Name: Alice Riley Date of Birth: [birth year]  Transition of Care [facility]) CM/SW Contact:    Ewing Schlein, LCSW Phone Number: 03/27/2022, 9:43 AM  Transition of Care Department [facility]) has reviewed patient and no TOC needs have been identified at this time. We will continue to monitor patient advancement through interdisciplinary progression rounds. If new patient transition needs arise, please place a TOC consult.

## 2022-03-27 NOTE — H&P (View-Only) (Signed)
Referring Provider: [facility] Primary Care Physician:  [facility] Primary Gastroenterologist:  Althia Forts ([facility])  Reason for Consultation:  Upper GI bleed  HPI: Alice Riley is a 61 y.o. female medical history significant of hypertension, anemia, hyperlipidemia, neuropathy, GI bleed, alcohol use presents for evaluation of melena and hematemesis.  Patient was admitted to [facility] regional 12/2021 for GI bleed. EGD showed pyloric ulcer and gastric erosions. Patient completed 8 week course of PPI but was lost to follow up and is no longer on medication.  Patient states yesterday (6/1) she began having diarrhea that was black. Had about 5 episodes of melena. Then she had one episode of hematemesis that was also black (no further emesis at this time). States when she was admitted back in march she only had melena, has not had hematemesis before. Reports alcohol use (beer and liquor) 3 drinks on week days and 5 drinks on weekends. Denies NSAID use. Previously used NSAIDs until her GI provider recommended stopping them due to ulcers. Denies tobacco use. Reports "sore" abdomen. Denies family history of colon cancer or GI issues. Denies blood thinner use.  Screening colonoscopy 01/2016 with [facility]. Report unavailable. Showed grade 2 internal hemorrhoids.  Past Medical History:  Diagnosis Date   Hypertension    Peripheral neuropathy    Scoliosis     Past Surgical History:  Procedure Laterality Date   BACK SURGERY     CHOLECYSTECTOMY      Prior to Admission medications   Medication Sig Start Date End Date Taking? Authorizing Provider  acetaminophen (TYLENOL) 500 MG tablet Take 500 mg by mouth every 6 (six) hours as needed for moderate pain.   Yes [provider]  amLODipine (NORVASC) 5 MG tablet Take 5 mg by mouth daily. 04/01/20  Yes [provider]  atorvastatin (LIPITOR) 80 MG tablet Take 80 mg by mouth daily. 09/26/18  Yes [provider]  ciprofloxacin-dexamethasone  (CIPRODEX) OTIC suspension Place 4 drops into both ears 2 (two) times daily as needed (ear pain). 06/13/20  Yes [provider]  hydrochlorothiazide (HYDRODIURIL) 12.5 MG tablet Take 12.5 mg by mouth daily.   Yes [provider]  ketorolac (ACULAR) 0.5 % ophthalmic solution Place 1 drop into the left eye 4 (four) times daily as needed (eye irritation). 03/10/22  Yes [provider]  lisinopril (ZESTRIL) 10 MG tablet Take 10 mg by mouth daily. 05/30/20  Yes [provider]  omeprazole (PRILOSEC) 40 MG capsule Take 40 mg by mouth daily. 01/12/22  Yes [provider]  pantoprazole (PROTONIX) 40 MG tablet Take 1 tablet (40 mg total) by mouth daily. 03/26/22  Yes Redwine, Madison A, PA-C  pregabalin (LYRICA) 100 MG capsule Take 100 mg by mouth 2 (two) times daily. 11/11/18  Yes [provider]  ondansetron (ZOFRAN) 4 MG tablet Take 1 tablet (4 mg total) by mouth every 8 (eight) hours as needed for nausea. Patient not taking: Reported on 03/26/2022 08/14/13   Clayton Bibles, PA-C    Scheduled Meds:  sodium chloride   Intravenous Once   amLODipine  5 mg Oral Daily   atorvastatin  80 mg Oral Daily   Chlorhexidine Gluconate Cloth  6 each Topical Daily   LORazepam  0-4 mg Intravenous Q6H   Or   LORazepam  0-4 mg Oral Q6H   [START ON 03/28/2022] LORazepam  0-4 mg Intravenous Q12H   Or   [START ON 03/28/2022] LORazepam  0-4 mg Oral Q12H   mouth rinse  15 mL Mouth  Rinse BID   pregabalin  100 mg Oral BID   sodium chloride flush  3 mL Intravenous Q12H   thiamine  100 mg Oral Daily   Or   thiamine  100 mg Intravenous Daily   Continuous Infusions:  sodium chloride     And   sodium chloride 100 mL/hr at 03/27/22 0804   cefTRIAXone (ROCEPHIN)  IV     pantoprazole 8 mg/hr (03/27/22 0841)   PRN Meds:.acetaminophen **OR** acetaminophen, ketorolac  Allergies as of 03/26/2022 - Review Complete 03/26/2022  Allergen Reaction Noted   Shellfish allergy Itching and  Rash 03/26/2022    Family History  Problem Relation Age of Onset   Hypertension Mother    Hypertension Father    Diabetes Father     Social History   Socioeconomic History   Marital status: Single    Spouse name: Not on file   Number of children: Not on file   Years of education: Not on file   Highest education level: Not on file  Occupational History   Not on file  Tobacco Use   Smoking status: Never   Smokeless tobacco: Never  Vaping Use   Vaping Use: Never used  Substance and Sexual Activity   Alcohol use: Yes    Comment: occ   Drug use: No   Sexual activity: Not on file  Other Topics Concern   Not on file  Social History Narrative   ** Merged History Encounter **       Social Determinants of Health   Financial Resource Strain: Not on file  Food Insecurity: Not on file  Transportation Needs: Not on file  Physical Activity: Not on file  Stress: Not on file  Social Connections: Not on file  Intimate Partner Violence: Not on file    Review of Systems: Review of Systems  Constitutional:  Negative for chills, fever and weight loss.  HENT:  Negative for hearing loss and tinnitus.   Eyes:  Negative for blurred vision and double vision.  Respiratory:  Negative for cough and hemoptysis.   Cardiovascular:  Negative for chest pain and palpitations.  Gastrointestinal:  Positive for abdominal pain, diarrhea, melena, nausea and vomiting. Negative for blood in stool, constipation and heartburn.  Genitourinary:  Negative for dysuria and urgency.  Musculoskeletal:  Negative for myalgias and neck pain.  Skin:  Negative for itching and rash.  Neurological:  Negative for seizures and loss of consciousness.  Psychiatric/Behavioral:  Negative for depression and suicidal ideas.     Physical Exam:Physical Exam Constitutional:      Appearance: Normal appearance.  HENT:     Head: Normocephalic and atraumatic.     Nose: Nose normal. No congestion.     Mouth/Throat:      Mouth: Mucous membranes are moist.     Pharynx: Oropharynx is clear.  Eyes:     Extraocular Movements: Extraocular movements intact.     Comments: Conjunctival pallor  Cardiovascular:     Rate and Rhythm: Normal rate and regular rhythm.  Pulmonary:     Effort: Pulmonary effort is normal. No respiratory distress.  Abdominal:     General: Abdomen is flat. Bowel sounds are normal. There is no distension.     Palpations: Abdomen is soft. There is no mass.     Tenderness: There is no abdominal tenderness. There is no guarding or rebound.     Hernia: No hernia is present.  Musculoskeletal:        General: No swelling. Normal  range of motion.     Cervical back: Normal range of motion and neck supple.  Skin:    General: Skin is warm and dry.     Coloration: Skin is not jaundiced.  Neurological:     General: No focal deficit present.     Mental Status: She is alert and oriented to person, place, and time.  Psychiatric:        Mood and Affect: Mood normal.        Behavior: Behavior normal.        Thought Content: Thought content normal.        Judgment: Judgment normal.    Vital signs: Vitals:   03/27/22 0700 03/27/22 0800  BP: (!) 164/94 (!) 172/92  Pulse: (!) 57 63  Resp:  13  Temp:  98.4 F (36.9 C)  SpO2:  100%   Last BM Date : 03/26/22    GI:  Lab Results: Recent Labs    03/26/22 1250 03/26/22 1848 03/27/22 0242  WBC 6.7 5.1 4.1  HGB 7.0* 7.2* 8.1*  8.1*  HCT 19.5* 20.6* 23.6*  23.2*  PLT 148* 132* 119*   BMET Recent Labs    03/26/22 1250 03/27/22 0242  NA 137 139  K 3.7 3.4*  CL 103 108  CO2 22 21*  GLUCOSE 90 85  BUN 8 8  CREATININE 0.52 0.59  CALCIUM 9.0 8.2*   LFT Recent Labs    03/27/22 0242  PROT 6.1*  ALBUMIN 3.5  AST 18  ALT 12  ALKPHOS 35*  BILITOT 1.3*   PT/INR Recent Labs    03/26/22 1250  LABPROT 14.5  INR 1.1     Studies/Results: No results found.  Impression: Upper GI bleed - hgb 8.1, s/p 1 unit pRBCs (7.0 on  arrival - baseline is 10) -Platelets 119 -BUN 8, creatinine 0.59 - positive fecal occult  Alcohol use - CIWA protocol - AST 18/ ALT 12/ Alk phos 35 - T. Bili 1.3  Plan: Plan for EGD tomorrow. I thoroughly discussed the procedures to include nature, alternatives, benefits, and risks including but not limited to bleeding, perforation, infection, anesthesia/cardiac and pulmonary complications. Patient provides understanding and gave verbal consent to proceed. Continue Protonix drip Continue clear liquid diet NPO at midnight Continue anti-emetics and supportive care as needed. [facility] will follow.      LOS: 1 day   Garnette Scheuermann  PA-C 03/27/2022, 9:13 AM  Contact #  [phone]

## 2022-03-27 NOTE — Progress Notes (Addendum)
PROGRESS NOTE    Alice Riley  WNU:[id number] DOB: Mar 01, 1961 DOA: 03/26/2022 PCP: [facility]   Brief Narrative: 61 year old with PMH significant for HTN, HLD, Anemia, GI bleed, alcohol use presents with melena and hematemesis. Continue to drink alcohol. Had recent endoscopy in march at [place] for GI bleed found to have pyloric ulcer and gastric erosion. Present with hb at 7.    Assessment & Plan:   Principal Problem:   Acute upper GI bleed Active Problems:   Essential hypertension   Mixed hyperlipidemia   Neuropathic pain   Alcohol use   1-Acute GI bleed;  Presents with hematemesis and melena.  Received one unit PRBC on admission.  Hb today at 8.  Continue to monitor hb.  IV Protonix Gtt.  GI consulted.  Monitor hb.  Transfuse as needed.   Alcohol Use disorder Continue with CIWA  Continue with thiamine and folic acid.   UTI;  UA with positive nitrates. More than 50 WBC>  Follow urine culture.  Continue with Ceftriaxone.    HTN;  Continue with Norvasc.  PRN hydralazine.   Neuropathy: Continue with lyrica.   HLD; Continue with Lipitor.   Thrombocytopenia; in setting alcohol, increase consuption in setting of bleeding.   Hypokalemia; Replete IV>   Estimated body mass index is 21.31 kg/m as calculated from the following:   Height as of this encounter: 5\' 6"  (1.676 m).   Weight as of this encounter: 59.9 kg.   DVT prophylaxis: SCD Code Status: Full Code Family Communication: Care discussed with patient.  Disposition Plan:  Status is: Inpatient Remains inpatient appropriate because:     Consultants:  GI  Procedures:  None  Antimicrobials:    Subjective: Report no further vomiting. She was vomiting coffee ground emesis.  We discussed about alcohol cessation.   Objective: Vitals:   03/27/22 0300 03/27/22 0400 03/27/22 0500 03/27/22 0508  BP: (!) 168/87  (!) 150/98 (!) 160/93  Pulse: (!) 55  60 (!) 57  Resp: 15   12   Temp:  97.7 F (36.5 C)    TempSrc:  Oral    SpO2: 100%  100% 100%  Weight:      Height:        Intake/Output Summary (Last 24 hours) at 03/27/2022 0736 Last data filed at 03/27/2022 0130 Gross per 24 hour  Intake 2192.21 ml  Output 150 ml  Net 2042.21 ml   Filed Weights   03/26/22 1151  Weight: 59.9 kg    Examination:  General exam: Appears calm and comfortable  Respiratory system: Clear to auscultation. Respiratory effort normal. Cardiovascular system: S1 & S2 heard, RRR.  Gastrointestinal system: Abdomen is nondistended, soft and nontender. No organomegaly or masses felt. Normal bowel sounds heard. Central nervous system: Alert and oriented. Extremities: Symmetric 5 x 5 power.    Data Reviewed: I have personally reviewed following labs and imaging studies  CBC: Recent Labs  Lab 03/26/22 1250 03/26/22 1848 03/27/22 0242  WBC 6.7 5.1 4.1  NEUTROABS 5.6  --   --   HGB 7.0* 7.2* 8.1*  8.1*  HCT 19.5* 20.6* 23.6*  23.2*  MCV 86.3 89.2 87.1  PLT 148* 132* 119*   Basic Metabolic Panel: Recent Labs  Lab 03/26/22 1250 03/27/22 0242  NA 137 139  K 3.7 3.4*  CL 103 108  CO2 22 21*  GLUCOSE 90 85  BUN 8 8  CREATININE 0.52 0.59  CALCIUM 9.0 8.2*   GFR: Estimated Creatinine Clearance:  71.8 mL/min (by C-G formula based on SCr of 0.59 mg/dL). Liver Function Tests: Recent Labs  Lab 03/26/22 1250 03/27/22 0242  AST 26 18  ALT 13 12  ALKPHOS 42 35*  BILITOT 1.5* 1.3*  PROT 7.2 6.1*  ALBUMIN 4.2 3.5   Recent Labs  Lab 03/26/22 1250  LIPASE 27   No results for input(s): AMMONIA in the last 168 hours. Coagulation Profile: Recent Labs  Lab 03/26/22 1250  INR 1.1   Cardiac Enzymes: No results for input(s): CKTOTAL, CKMB, CKMBINDEX, TROPONINI in the last 168 hours. BNP (last 3 results) No results for input(s): PROBNP in the last 8760 hours. HbA1C: No results for input(s): HGBA1C in the last 72 hours. CBG: No results for input(s): GLUCAP in the  last 168 hours. Lipid Profile: No results for input(s): CHOL, HDL, LDLCALC, TRIG, CHOLHDL, LDLDIRECT in the last 72 hours. Thyroid Function Tests: No results for input(s): TSH, T4TOTAL, FREET4, T3FREE, THYROIDAB in the last 72 hours. Anemia Panel: No results for input(s): VITAMINB12, FOLATE, FERRITIN, TIBC, IRON, RETICCTPCT in the last 72 hours. Sepsis Labs: No results for input(s): PROCALCITON, LATICACIDVEN in the last 168 hours.  Recent Results (from the past 240 hour(s))  MRSA Next Gen by PCR, Nasal     Status: None   Collection Time: 03/26/22  5:54 PM   Specimen: Nasal Mucosa; Nasal Swab  Result Value Ref Range Status   MRSA by PCR Next Gen NOT DETECTED NOT DETECTED Final    Comment: (NOTE) The GeneXpert MRSA Assay (FDA approved for NASAL specimens only), is one component of a comprehensive MRSA colonization surveillance program. It is not intended to diagnose MRSA infection nor to guide or monitor treatment for MRSA infections. Test performance is not FDA approved in patients less than 26 years old. Performed at [facility], [address], [place], [place] [address]          Radiology Studies: No results found.      Scheduled Meds:  sodium chloride   Intravenous Once   amLODipine  5 mg Oral Daily   atorvastatin  80 mg Oral Daily   Chlorhexidine Gluconate Cloth  6 each Topical Daily   LORazepam  0-4 mg Intravenous Q6H   Or   LORazepam  0-4 mg Oral Q6H   [START ON 03/28/2022] LORazepam  0-4 mg Intravenous Q12H   Or   [START ON 03/28/2022] LORazepam  0-4 mg Oral Q12H   mouth rinse  15 mL Mouth Rinse BID   pregabalin  100 mg Oral BID   sodium chloride flush  3 mL Intravenous Q12H   thiamine  100 mg Oral Daily   Or   thiamine  100 mg Intravenous Daily   Continuous Infusions:  sodium chloride     And   sodium chloride 125 mL/hr at 03/27/22 0633   cefTRIAXone (ROCEPHIN)  IV     pantoprazole 8 mg/hr (03/27/22 0115)   potassium chloride 10  mEq (03/27/22 0638)     LOS: 1 day    Time spent: 35 minutes.     Alba Cory, MD [facility]   If 7PM-7AM, please contact night-coverage [email or web address]  03/27/2022, 7:36 AM

## 2022-03-28 ENCOUNTER — Inpatient Hospital Stay (HOSPITAL_COMMUNITY): Payer: Medicare Other | Admitting: Anesthesiology

## 2022-03-28 ENCOUNTER — Encounter (HOSPITAL_COMMUNITY): Payer: Self-pay | Admitting: Internal Medicine

## 2022-03-28 ENCOUNTER — Encounter (HOSPITAL_COMMUNITY)
Admission: EM | Disposition: A | Discharge status: Home / Self Care | Payer: Self-pay | Source: Home / Self Care | Attending: Internal Medicine

## 2022-03-28 DIAGNOSIS — K922 Gastrointestinal hemorrhage, unspecified: Secondary | ICD-10-CM | POA: Diagnosis not present

## 2022-03-28 DIAGNOSIS — K259 Gastric ulcer, unspecified as acute or chronic, without hemorrhage or perforation: Secondary | ICD-10-CM

## 2022-03-28 DIAGNOSIS — I10 Essential (primary) hypertension: Secondary | ICD-10-CM

## 2022-03-28 DIAGNOSIS — K3189 Other diseases of stomach and duodenum: Secondary | ICD-10-CM

## 2022-03-28 HISTORY — PX: ESOPHAGOGASTRODUODENOSCOPY: SHX5428

## 2022-03-28 HISTORY — PX: HEMOSTASIS CLIP PLACEMENT: SHX6857

## 2022-03-28 HISTORY — PX: BIOPSY: SHX5522

## 2022-03-28 LAB — FERRITIN: Ferritin: 163 ng/mL (ref 11–307)

## 2022-03-28 LAB — BASIC METABOLIC PANEL
Anion gap: 6 (ref 5–15)
BUN: <5 mg/dL — ABNORMAL LOW (ref 6–20)
CO2: 23 mmol/L (ref 22–32)
Calcium: 8.6 mg/dL — ABNORMAL LOW (ref 8.9–10.3)
Chloride: 108 mmol/L (ref 98–111)
Creatinine, Ser: 0.55 mg/dL (ref 0.44–1.00)
GFR, Estimated: >60 mL/min (ref >60)
Glucose, Bld: 92 mg/dL (ref 70–99)
Potassium: 3.5 mmol/L (ref 3.5–5.1)
Sodium: 137 mmol/L (ref 135–145)

## 2022-03-28 LAB — CBC
HCT: 26.8 % — ABNORMAL LOW (ref 36.0–46.0)
HCT: 31.4 % — ABNORMAL LOW (ref 36.0–46.0)
Hemoglobin: 9.2 g/dL — ABNORMAL LOW (ref 12.0–15.0)
Hemoglobin: 10.4 g/dL — ABNORMAL LOW (ref 12.0–15.0)
MCH: 30.3 pg (ref 26.0–34.0)
MCH: 30 pg (ref 26.0–34.0)
MCHC: 34.3 g/dL (ref 30.0–36.0)
MCHC: 33.1 g/dL (ref 30.0–36.0)
MCV: 88.2 fL (ref 80.0–100.0)
MCV: 90.5 fL (ref 80.0–100.0)
Platelets: 124 10*3/uL — ABNORMAL LOW (ref 150–400)
Platelets: 135 10*3/uL — ABNORMAL LOW (ref 150–400)
RBC: 3.04 MIL/uL — ABNORMAL LOW (ref 3.87–5.11)
RBC: 3.47 MIL/uL — ABNORMAL LOW (ref 3.87–5.11)
RDW: 20.2 % — ABNORMAL HIGH (ref 11.5–15.5)
RDW: 20.9 % — ABNORMAL HIGH (ref 11.5–15.5)
WBC: 4.1 10*3/uL (ref 4.0–10.5)
WBC: 7.2 10*3/uL (ref 4.0–10.5)
nRBC: 0.5 % — ABNORMAL HIGH (ref 0.0–0.2)
nRBC: 0.4 % — ABNORMAL HIGH (ref 0.0–0.2)

## 2022-03-28 LAB — VITAMIN B12: Vitamin B-12: 168 pg/mL — ABNORMAL LOW (ref 180–914)

## 2022-03-28 LAB — IRON AND TIBC
Iron: 21 ug/dL — ABNORMAL LOW (ref 28–170)
Saturation Ratios: 11 % (ref 10.4–31.8)
TIBC: 199 ug/dL — ABNORMAL LOW (ref 250–450)
UIBC: 178 ug/dL

## 2022-03-28 LAB — RETICULOCYTES
Immature Retic Fract: 35.7 % — ABNORMAL HIGH (ref 2.3–15.9)
RBC.: 2.87 MIL/uL — ABNORMAL LOW (ref 3.87–5.11)
Retic Count, Absolute: 31.9 10*3/uL (ref 19.0–186.0)
Retic Ct Pct: 1.1 % (ref 0.4–3.1)

## 2022-03-28 LAB — FOLATE: Folate: 8 ng/mL (ref >5.9)

## 2022-03-28 LAB — URINE CULTURE: Culture: NO GROWTH

## 2022-03-28 SURGERY — EGD (ESOPHAGOGASTRODUODENOSCOPY)
Anesthesia: Monitor Anesthesia Care

## 2022-03-28 MED ORDER — SODIUM CHLORIDE 0.9 % IV SOLN: INTRAVENOUS | Status: DC

## 2022-03-28 MED ORDER — PROPOFOL 10 MG/ML IV BOLUS
INTRAVENOUS | Status: DC | PRN
  Administered 2022-03-28 (×5): 20 mg via INTRAVENOUS

## 2022-03-28 MED ORDER — SODIUM CHLORIDE 0.9 % IV SOLN
250.0000 mg | Freq: Once | INTRAVENOUS | Status: AC
  Administered 2022-03-28: 250 mg via INTRAVENOUS
  Filled 2022-03-28: qty 20

## 2022-03-28 MED ORDER — SODIUM CHLORIDE 0.9 % IV SOLN
1.0000 g | Freq: Every day | INTRAVENOUS | Status: AC
  Administered 2022-03-28 – 2022-03-29 (×2): 1 g via INTRAVENOUS
  Filled 2022-03-28 (×2): qty 10

## 2022-03-28 MED ORDER — CYANOCOBALAMIN 1000 MCG/ML IJ SOLN
1000.0000 ug | Freq: Every day | INTRAMUSCULAR | Status: DC
  Administered 2022-03-28 – 2022-03-29 (×2): 1000 ug via INTRAMUSCULAR
  Filled 2022-03-28 (×2): qty 1

## 2022-03-28 MED ORDER — PROPOFOL 500 MG/50ML IV EMUL
INTRAVENOUS | Status: AC
  Filled 2022-03-28: qty 50

## 2022-03-28 MED ORDER — PANTOPRAZOLE SODIUM 40 MG IV SOLR
40.0000 mg | Freq: Two times a day (BID) | INTRAVENOUS | Status: DC
  Administered 2022-03-28 – 2022-03-29 (×3): 40 mg via INTRAVENOUS
  Filled 2022-03-28 (×3): qty 10

## 2022-03-28 MED ORDER — PROPOFOL 500 MG/50ML IV EMUL
INTRAVENOUS | Status: DC | PRN
  Administered 2022-03-28: 125 ug/kg/min via INTRAVENOUS

## 2022-03-28 NOTE — Op Note (Signed)
[facility] Patient Name: Alice Riley Procedure Date: 03/28/2022 MRN: [id number] Attending MD: Kerin Salen , MD Date of Birth: 1961/04/11 CSN: [id number] Age: 61 Admit Type: Inpatient Procedure:                Upper GI endoscopy Indications:              Coffee-ground emesis, Melena, significant alcohol                            use Providers:                Kerin Salen, MD, Vicki Mallet, RN, Rozetta Nunnery,                            Technician Referring MD:             [facility] Medicines:                Monitored Anesthesia Care Complications:            No immediate complications. Estimated blood loss:                            Minimal. Estimated Blood Loss:     Estimated blood loss was minimal. Procedure:                Pre-Anesthesia Assessment:                           - Prior to the procedure, a History and Physical                            was performed, and patient medications and                            allergies were reviewed. The patient's tolerance of                            previous anesthesia was also reviewed. The risks                            and benefits of the procedure and the sedation                            options and risks were discussed with the patient.                            All questions were answered, and informed consent                            was obtained. Prior Anticoagulants: The patient has                            taken no previous anticoagulant or antiplatelet                            agents. ASA Grade Assessment: II - A  patient with                            mild systemic disease. After reviewing the risks                            and benefits, the patient was deemed in                            satisfactory condition to undergo the procedure.                           After obtaining informed consent, the endoscope was                            passed under direct vision. Throughout the                             procedure, the patient's blood pressure, pulse, and                            oxygen saturations were monitored continuously. The                            EGD-OR was introduced through the mouth, and                            advanced to the second part of duodenum. The upper                            GI endoscopy was accomplished without difficulty.                            The patient tolerated the procedure well. Scope In: Scope Out: Findings:      The examined esophagus was normal.      The Z-line was regular and was found 35 cm from the incisors.      Four non-bleeding cratered, linear and superficial gastric ulcers with       pigmented material were found in the gastric antrum. The largest lesion       was 9 mm in largest dimension. For hemostasis, two hemostatic clips were       successfully placed (MR conditional). There was no bleeding during, or       at the end, of the procedure.      The cardia and gastric fundus were normal on retroflexion.      Diffuse moderately erythematous mucosa without active bleeding and with       no stigmata of bleeding was found in the duodenal bulb, in the first       portion of the duodenum and in the second portion of the duodenum.      Biopsies were taken with a cold forceps for Helicobacter pylori testing. Impression:               - Normal esophagus.                           -  Z-line regular, 35 cm from the incisors.                           - Non-bleeding gastric ulcers with pigmented                            material. Clips (MR conditional) were placed.                           - Erythematous duodenopathy.                           - Biopsies were taken with a cold forceps for                            Helicobacter pylori testing. Moderate Sedation:      Patient did not receive moderate sedation for this procedure, but       instead received monitored anesthesia care. Recommendation:           - Resume  regular diet.                           - Continue present medications.                           - Await pathology results.                           - PPI BID for 2 months. Procedure Code(s):        --- Professional ---                           [id number], 61, Esophagogastroduodenoscopy, flexible,                            transoral; with control of bleeding, any method                           43239, Esophagogastroduodenoscopy, flexible,                            transoral; with biopsy, single or multiple Diagnosis Code(s):        --- Professional ---                           K25.9, Gastric ulcer, unspecified as acute or                            chronic, without hemorrhage or perforation                           K31.89, Other diseases of stomach and duodenum                           K92.0, Hematemesis  K92.1, Melena (includes Hematochezia) CPT copyright 2019 American Medical Association. All rights reserved. The codes documented in this report are preliminary and upon coder review may  be revised to meet current compliance requirements. Kerin SalenArya [name], MD 03/28/2022 9:41:14 AM This report has been signed electronically. Number of Addenda: 0

## 2022-03-28 NOTE — Interval H&P Note (Signed)
History and Physical Interval Note: 61 year old female with significant alcohol use, history of gastric ulcer, with melena and coffee-ground emesis for an EGD today.  03/28/2022 8:52 AM  Alice Riley  has presented today for EGD, with the diagnosis of melena/hematemesis, anemia, history of pyloric ulcer.  The various methods of treatment have been discussed with the patient and family. After consideration of risks, benefits and other options for treatment, the patient has consented to  Procedure(s): ESOPHAGOGASTRODUODENOSCOPY (EGD) (N/A) as a surgical intervention.  The patient's history has been reviewed, patient examined, no change in status, stable for surgery.  I have reviewed the patient's chart and labs.  Questions were answered to the patient's satisfaction.     Kerin Salen

## 2022-03-28 NOTE — Anesthesia Preprocedure Evaluation (Addendum)
Anesthesia Evaluation  Patient identified by MRN, date of birth, ID band Patient awake    Reviewed: Allergy & Precautions, NPO status , Patient's Chart, lab work & pertinent test results  Airway Mallampati: III  TM Distance: >3 FB Neck ROM: Full    Dental  (+) Teeth Intact, Dental Advisory Given   Pulmonary neg pulmonary ROS,    breath sounds clear to auscultation       Cardiovascular hypertension, Pt. on medications  Rhythm:Regular     Neuro/Psych negative neurological ROS  negative psych ROS   GI/Hepatic Neg liver ROS, GERD  Medicated,  Endo/Other  negative endocrine ROS  Renal/GU negative Renal ROS     Musculoskeletal negative musculoskeletal ROS (+)   Abdominal Normal abdominal exam  (+)   Peds  Hematology negative hematology ROS (+)   Anesthesia Other Findings   Reproductive/Obstetrics                            Anesthesia Physical Anesthesia Plan  ASA: 2  Anesthesia Plan: MAC   Post-op Pain Management:    Induction: Intravenous  PONV Risk Score and Plan: 0 and Propofol infusion  Airway Management Planned: Natural Airway and Simple Face Mask  Additional Equipment:   Intra-op Plan:   Post-operative Plan:   Informed Consent: I have reviewed the patients History and Physical, chart, labs and discussed the procedure including the risks, benefits and alternatives for the proposed anesthesia with the patient or authorized representative who has indicated his/her understanding and acceptance.       Plan Discussed with: CRNA  Anesthesia Plan Comments:        Anesthesia Quick Evaluation

## 2022-03-28 NOTE — Transfer of Care (Signed)
Immediate Anesthesia Transfer of Care Note  Patient: Alice Riley  Procedure(s) Performed: ESOPHAGOGASTRODUODENOSCOPY (EGD) HEMOSTASIS CLIP PLACEMENT BIOPSY  Patient Location: PACU  Anesthesia Type:MAC  Level of Consciousness: awake, alert , oriented and patient cooperative  Airway & Oxygen Therapy: Patient Spontanous Breathing and Patient connected to nasal cannula oxygen  Post-op Assessment: Report given to RN, Post -op Vital signs reviewed and stable and Vital signs:  B/P 107/61, Pulse 78, Resp 21, Pulse Ox 100%  Post vital signs: Reviewed and stable  Last Vitals:  Vitals Value Taken Time  BP    Temp    Pulse    Resp    SpO2      Last Pain:  Vitals:   03/28/22 0844  TempSrc: Temporal  PainSc: 0-No pain         Complications: No notable events documented.

## 2022-03-28 NOTE — Anesthesia Postprocedure Evaluation (Signed)
Anesthesia Post Note  Patient: Ruchel Brandenburger  Procedure(s) Performed: ESOPHAGOGASTRODUODENOSCOPY (EGD) HEMOSTASIS CLIP PLACEMENT BIOPSY     Patient location during evaluation: PACU Anesthesia Type: MAC Level of consciousness: awake and alert Pain management: pain level controlled Vital Signs Assessment: post-procedure vital signs reviewed and stable Respiratory status: spontaneous breathing, nonlabored ventilation, respiratory function stable and patient connected to nasal cannula oxygen Cardiovascular status: stable and blood pressure returned to baseline Postop Assessment: no apparent nausea or vomiting Anesthetic complications: no   No notable events documented.  Last Vitals:  Vitals:   03/28/22 1132 03/28/22 1200  BP: (!) 161/90 (!) 153/77  Pulse:  (!) 116  Resp:  (!) 23  Temp:    SpO2:  100%    Last Pain:  Vitals:   03/28/22 1200  TempSrc:   PainSc: 0-No pain                 Effie Berkshire

## 2022-03-28 NOTE — Anesthesia Procedure Notes (Signed)
Procedure Name: MAC Date/Time: 03/28/2022 9:15 AM Performed by: Lollie Sails, CRNA Pre-anesthesia Checklist: Patient identified, Emergency Drugs available, Suction available, Patient being monitored and Timeout performed Oxygen Delivery Method: Nasal cannula Placement Confirmation: positive ETCO2

## 2022-03-28 NOTE — Progress Notes (Signed)
PROGRESS NOTE    Alice Riley  VPX:[id number] DOB: 1961-04-17 DOA: 03/26/2022 PCP: [facility]   Brief Narrative: 61 year old with PMH significant for HTN, HLD, Anemia, GI bleed, alcohol use presents with melena and hematemesis. Continue to drink alcohol. Had recent endoscopy in march at [place] for GI bleed found to have pyloric ulcer and gastric erosion. Present with hb at 7.    Assessment & Plan:   Principal Problem:   Acute upper GI bleed Active Problems:   Essential hypertension   Mixed hyperlipidemia   Neuropathic pain   Alcohol use   1-Acute GI bleed;  Presents with hematemesis and melena.  Received one unit PRBC on admission.  Continue to monitor hb.  IV Protonix Gtt.  GI consulted. Underwent Endoscopy: Normal esophagus.  Nonbleeding gastric ulcer with pigmented material.  Clips were placed.  Erythematosus duodenopathy. Monitor hb. Hb stable at 9.  Transfuse as needed.  Patient will need PPI twice daily for 2 months.  Plan to start regular diet.  Iron deficiency anemia, acute blood loss anemia in setting GI bleed.  Plan to give IV iron.   B 12 deficiency:  Start B 12 supplement.   Alcohol Use disorder Continue with CIWA  Continue with thiamine and folic acid.   UTI;  UA with positive nitrates. More than 50 WBC>  Follow urine culture. No growth to date.  Continue with Ceftriaxone. Treat for 3 days.    HTN;  Continue with Norvasc.  PRN hydralazine.   Neuropathy: Continue with lyrica.   HLD; Continue with Lipitor.   Thrombocytopenia; in setting alcohol, increase consuption in setting of bleeding.   Hypokalemia; Replaced.   Estimated body mass index is 21.31 kg/m as calculated from the following:   Height as of this encounter: 5\' 6"  (1.676 m).   Weight as of this encounter: 59.9 kg.   DVT prophylaxis: SCD Code Status: Full Code Family Communication: Care discussed with patient.  Disposition Plan:  Status is: Inpatient Remains  inpatient appropriate because: management GI bleed. Transfer to regular floor.     Consultants:  GI  Procedures:  None  Antimicrobials:    Subjective: She is feeling better. Denies abdominal pain.   Objective: Vitals:   03/28/22 1001 03/28/22 1024 03/28/22 1100 03/28/22 1132  BP: 129/68 (!) 159/102 (!) 151/91 (!) 161/90  Pulse: 64  (!) 58   Resp: 18  14   Temp:      TempSrc:      SpO2: 100%  100%   Weight:      Height:        Intake/Output Summary (Last 24 hours) at 03/28/2022 1156 Last data filed at 03/28/2022 1015 Gross per 24 hour  Intake 2544.48 ml  Output 250 ml  Net 2294.48 ml    Filed Weights   03/26/22 1151  Weight: 59.9 kg    Examination:  General exam: NAD Respiratory system: CTA Cardiovascular system: S 1, S 2 RRR  Gastrointestinal system: BS present, soft, nt Central nervous system: Alert, follows command Extremities: No edema.     Data Reviewed: I have personally reviewed following labs and imaging studies  CBC: Recent Labs  Lab 03/26/22 1250 03/26/22 1848 03/27/22 0242 03/27/22 1348 03/27/22 1803 03/28/22 0300  WBC 6.7 5.1 4.1  --  6.6 4.1  NEUTROABS 5.6  --   --   --   --   --   HGB 7.0* 7.2* 8.1*  8.1* 10.5* 9.7* 9.2*  HCT 19.5* 20.6* 23.6*  23.2* 30.7* 28.1* 26.8*  MCV 86.3 89.2 87.1  --  88.1 88.2  PLT 148* 132* 119*  --  146* 124*    Basic Metabolic Panel: Recent Labs  Lab 03/26/22 1250 03/27/22 0242 03/28/22 0300  NA 137 139 137  K 3.7 3.4* 3.5  CL 103 108 108  CO2 22 21* 23  GLUCOSE 90 85 92  BUN 8 8 <5*  CREATININE 0.52 0.59 0.55  CALCIUM 9.0 8.2* 8.6*    GFR: Estimated Creatinine Clearance: 71.8 mL/min (by C-G formula based on SCr of 0.55 mg/dL). Liver Function Tests: Recent Labs  Lab 03/26/22 1250 03/27/22 0242  AST 26 18  ALT 13 12  ALKPHOS 42 35*  BILITOT 1.5* 1.3*  PROT 7.2 6.1*  ALBUMIN 4.2 3.5    Recent Labs  Lab 03/26/22 1250  LIPASE 27    No results for input(s): AMMONIA in the  last 168 hours. Coagulation Profile: Recent Labs  Lab 03/26/22 1250  INR 1.1    Cardiac Enzymes: No results for input(s): CKTOTAL, CKMB, CKMBINDEX, TROPONINI in the last 168 hours. BNP (last 3 results) No results for input(s): PROBNP in the last 8760 hours. HbA1C: No results for input(s): HGBA1C in the last 72 hours. CBG: No results for input(s): GLUCAP in the last 168 hours. Lipid Profile: No results for input(s): CHOL, HDL, LDLCALC, TRIG, CHOLHDL, LDLDIRECT in the last 72 hours. Thyroid Function Tests: No results for input(s): TSH, T4TOTAL, FREET4, T3FREE, THYROIDAB in the last 72 hours. Anemia Panel: Recent Labs    03/28/22 0300  VITAMINB12 168*  FOLATE 8.0  FERRITIN 163  TIBC 199*  IRON 21*  RETICCTPCT 1.1   Sepsis Labs: No results for input(s): PROCALCITON, LATICACIDVEN in the last 168 hours.  Recent Results (from the past 240 hour(s))  MRSA Next Gen by PCR, Nasal     Status: None   Collection Time: 03/26/22  5:54 PM   Specimen: Nasal Mucosa; Nasal Swab  Result Value Ref Range Status   MRSA by PCR Next Gen NOT DETECTED NOT DETECTED Final    Comment: (NOTE) The GeneXpert MRSA Assay (FDA approved for NASAL specimens only), is one component of a comprehensive MRSA colonization surveillance program. It is not intended to diagnose MRSA infection nor to guide or monitor treatment for MRSA infections. Test performance is not FDA approved in patients less than 73 years old. Performed at [facility], [address] [place], [place] [address]   Urine Culture     Status: None   Collection Time: 03/27/22  5:35 AM   Specimen: Urine, Clean Catch  Result Value Ref Range Status   Specimen Description   Final    URINE, CLEAN CATCH Performed at [facility], [address] [place], [place] [address]    Special Requests   Final    NONE Performed at [facility], [address] [place], [place] [address]     Culture   Final    NO GROWTH Performed at [facility], [address], [place], [place] [address]    Report Status 03/28/2022 FINAL  Final          Radiology Studies: No results found.      Scheduled Meds:  sodium chloride   Intravenous Once   amLODipine  5 mg Oral Daily   atorvastatin  80 mg Oral Daily   Chlorhexidine Gluconate Cloth  6 each Topical Daily   cyanocobalamin  1,000 mcg Intramuscular Daily  LORazepam  0-4 mg Intravenous Q6H   Or   LORazepam  0-4 mg Oral Q6H   LORazepam  0-4 mg Intravenous Q12H   Or   LORazepam  0-4 mg Oral Q12H   mouth rinse  15 mL Mouth Rinse BID   pantoprazole (PROTONIX) IV  40 mg Intravenous Q12H   pregabalin  100 mg Oral BID   sodium chloride flush  3 mL Intravenous Q12H   thiamine  100 mg Oral Daily   Or   thiamine  100 mg Intravenous Daily   Continuous Infusions:  sodium chloride     And   sodium chloride 100 mL/hr at 03/28/22 1023   cefTRIAXone (ROCEPHIN)  IV Stopped (03/27/22 1247)   ferric gluconate (FERRLECIT) IVPB 250 mg (03/28/22 1039)     LOS: 2 days    Time spent: 35 minutes.     Alba CoryBelkys A Sahand Gosch, MD [facility]   If 7PM-7AM, please contact night-coverage [email or web address]  03/28/2022, 11:56 AM

## 2022-03-29 DIAGNOSIS — K922 Gastrointestinal hemorrhage, unspecified: Secondary | ICD-10-CM | POA: Diagnosis not present

## 2022-03-29 LAB — CBC
HCT: 24.4 % — ABNORMAL LOW (ref 36.0–46.0)
Hemoglobin: 8.3 g/dL — ABNORMAL LOW (ref 12.0–15.0)
MCH: 30.2 pg (ref 26.0–34.0)
MCHC: 34 g/dL (ref 30.0–36.0)
MCV: 88.7 fL (ref 80.0–100.0)
Platelets: 126 10*3/uL — ABNORMAL LOW (ref 150–400)
RBC: 2.75 MIL/uL — ABNORMAL LOW (ref 3.87–5.11)
RDW: 20.3 % — ABNORMAL HIGH (ref 11.5–15.5)
WBC: 4 10*3/uL (ref 4.0–10.5)
nRBC: 0.5 % — ABNORMAL HIGH (ref 0.0–0.2)

## 2022-03-29 MED ORDER — SODIUM CHLORIDE 0.9 % IV SOLN
250.0000 mg | Freq: Once | INTRAVENOUS | Status: AC
  Administered 2022-03-29: 250 mg via INTRAVENOUS
  Filled 2022-03-29: qty 20

## 2022-03-29 MED ORDER — VITAMIN B-12 1000 MCG PO TABS: 1000.0000 ug | ORAL_TABLET | Freq: Every day | ORAL | 0 refills | Status: AC

## 2022-03-29 MED ORDER — THIAMINE HCL 100 MG PO TABS: 100.0000 mg | ORAL_TABLET | Freq: Every day | ORAL | 0 refills | Status: AC

## 2022-03-29 MED ORDER — PANTOPRAZOLE SODIUM 40 MG PO TBEC: 40.0000 mg | DELAYED_RELEASE_TABLET | Freq: Two times a day (BID) | ORAL | 1 refills | Status: AC

## 2022-03-29 MED ORDER — FERROUS SULFATE 325 (65 FE) MG PO TABS: 325.0000 mg | ORAL_TABLET | Freq: Every day | ORAL | 1 refills | Status: AC

## 2022-03-29 NOTE — Discharge Summary (Signed)
Physician Discharge Summary   Patient: Alice Riley MRN: [id number] DOB: 1961-02-20  Admit date:     03/26/2022  Discharge date: 03/29/22  Discharge Physician: Alba Cory   PCP: [facility]   Recommendations at discharge:   Needs cbc to follow hb.  Follow bx result.  She will need repeat endoscopy to document resolution ulcer.   Discharge Diagnoses: Principal Problem:   Acute upper GI bleed Active Problems:   Essential hypertension   Mixed hyperlipidemia   Neuropathic pain   Alcohol use  Resolved Problems:   * No resolved hospital problems. *  Hospital Course: 61 year old with PMH significant for HTN, HLD, Anemia, GI bleed, alcohol use presents with melena and hematemesis. Continue to drink alcohol. Had recent endoscopy in march at [place] for GI bleed found to have pyloric ulcer and gastric erosion. Present with hb at 7.     Assessment and Plan: 1-Acute GI bleed; Gastric Ulcer.  Presents with hematemesis and melena.  Received one unit PRBC on admission.  Continue to monitor hb.  IV Protonix Gtt.  GI consulted. Underwent Endoscopy: Normal esophagus.  Nonbleeding gastric ulcer with pigmented material.  Clips were placed.  Erythematosus duodenopathy. Monitor hb. Hb stable at 9. --8  Transfuse as needed.  Patient will need PPI twice daily for 2 months.   Tolerating diet.  No further melena.    Iron deficiency anemia, acute blood loss anemia in setting GI bleed.  Received IV iron 6/03. Repeat IV dose 6/04. Discharge on oral iron start on 6/10.   B 12 deficiency: B 12: 168 Started  B 12 supplement.    Alcohol Use disorder Continue with CIWA  Continue with thiamine and folic acid.    UTI;  UA with positive nitrates. More than 50 WBC>  Follow urine culture. No growth to date.  Continue with Ceftriaxone. Treated for 3 days.      HTN;  Continue with Norvasc.  PRN hydralazine.    Neuropathy: Continue with lyrica.    HLD; Continue with  Lipitor.    Thrombocytopenia; in setting alcohol, increase consuption in setting of bleeding.    Hypokalemia; Replaced.           Consultants: GI Procedures performed: Endoscopy  Disposition: Home Diet recommendation:  Discharge Diet Orders (From admission, onward)     Start     Ordered   03/29/22 0000  Diet - low sodium heart healthy        03/29/22 1125           Regular diet DISCHARGE MEDICATION: Allergies as of 03/29/2022       Reactions   Shellfish Allergy Itching, Rash        Medication List     STOP taking these medications    ciprofloxacin-dexamethasone OTIC suspension Commonly known as: CIPRODEX   hydrochlorothiazide 12.5 MG tablet Commonly known as: HYDRODIURIL   omeprazole 40 MG capsule Commonly known as: PRILOSEC   ondansetron 4 MG tablet Commonly known as: ZOFRAN       TAKE these medications    acetaminophen 500 MG tablet Commonly known as: TYLENOL Take 500 mg by mouth every 6 (six) hours as needed for moderate pain.   amLODipine 5 MG tablet Commonly known as: NORVASC Take 5 mg by mouth daily.   atorvastatin 80 MG tablet Commonly known as: LIPITOR Take 80 mg by mouth daily.   ketorolac 0.5 % ophthalmic solution Commonly known as: ACULAR Place 1 drop into the left eye  4 (four) times daily as needed (eye irritation).   lisinopril 10 MG tablet Commonly known as: ZESTRIL Take 10 mg by mouth daily.   pantoprazole 40 MG tablet Commonly known as: PROTONIX Take 1 tablet (40 mg total) by mouth 2 (two) times daily.   pregabalin 100 MG capsule Commonly known as: LYRICA Take 100 mg by mouth 2 (two) times daily.   thiamine 100 MG tablet Take 1 tablet (100 mg total) by mouth daily. Start taking on: March 30, 2022   vitamin B-12 1000 MCG tablet Commonly known as: CYANOCOBALAMIN Take 1 tablet (1,000 mcg total) by mouth daily.        Follow-up Information     Schedule an appointment as soon as possible for a visit  with  Hilliard, Clovis Riley, PA-C.   Specialty: Gastroenterology Contact information: [address] [place] [address] [phone]                Discharge Exam: Ceasar Mons Weights   03/26/22 1151  Weight: 59.9 kg   General; NAD Lung; CTA  Condition at discharge: stable  The results of significant diagnostics from this hospitalization (including imaging, microbiology, ancillary and laboratory) are listed below for reference.   Imaging Studies: No results found.  Microbiology: Results for orders placed or performed during the hospital encounter of 03/26/22  MRSA Next Gen by PCR, Nasal     Status: None   Collection Time: 03/26/22  5:54 PM   Specimen: Nasal Mucosa; Nasal Swab  Result Value Ref Range Status   MRSA by PCR Next Gen NOT DETECTED NOT DETECTED Final    Comment: (NOTE) The GeneXpert MRSA Assay (FDA approved for NASAL specimens only), is one component of a comprehensive MRSA colonization surveillance program. It is not intended to diagnose MRSA infection nor to guide or monitor treatment for MRSA infections. Test performance is not FDA approved in patients less than 73 years old. Performed at [facility], [address] [place], [place] [address]   Urine Culture     Status: None   Collection Time: 03/27/22  5:35 AM   Specimen: Urine, Clean Catch  Result Value Ref Range Status   Specimen Description   Final    URINE, CLEAN CATCH Performed at [facility], [address], [place], [place] [address]    Special Requests   Final    NONE Performed at [facility], [address] [place], [place] [address]    Culture   Final    NO GROWTH Performed at [facility], [address], [place], [place] [address]    Report Status 03/28/2022 FINAL  Final    Labs: CBC: Recent Labs  Lab 03/26/22 1250 03/26/22 1848 03/27/22 0242 03/27/22 1348 03/27/22 1803 03/28/22 0300  03/28/22 1851 03/29/22 0815  WBC 6.7   < > 4.1  --  6.6 4.1 7.2 4.0  NEUTROABS 5.6  --   --   --   --   --   --   --   HGB 7.0*   < > 8.1*  8.1* 10.5* 9.7* 9.2* 10.4* 8.3*  HCT 19.5*   < > 23.6*  23.2* 30.7* 28.1* 26.8* 31.4* 24.4*  MCV 86.3   < > 87.1  --  88.1 88.2 90.5 88.7  PLT 148*   < > 119*  --  146* 124* 135* 126*   < > = values in this interval not displayed.   Basic Metabolic  Panel: Recent Labs  Lab 03/26/22 1250 03/27/22 0242 03/28/22 0300  NA 137 139 137  K 3.7 3.4* 3.5  CL 103 108 108  CO2 22 21* 23  GLUCOSE 90 85 92  BUN 8 8 <5*  CREATININE 0.52 0.59 0.55  CALCIUM 9.0 8.2* 8.6*   Liver Function Tests: Recent Labs  Lab 03/26/22 1250 03/27/22 0242  AST 26 18  ALT 13 12  ALKPHOS 42 35*  BILITOT 1.5* 1.3*  PROT 7.2 6.1*  ALBUMIN 4.2 3.5   CBG: No results for input(s): GLUCAP in the last 168 hours.  Discharge time spent: greater than 30 minutes.  Signed: Alba CoryBelkys A Ellia Knowlton, MD [facility] 03/29/2022

## 2022-03-29 NOTE — Progress Notes (Signed)
Subjective: Patient denies abdominal pain, is tolerating regular diet, denies further black stools.  Objective: Vital signs in last 24 hours: Temp:  [98.1 F (36.7 C)-98.7 F (37.1 C)] 98.4 F (36.9 C) (06/04 0549) Pulse Rate:  [58-116] 76 (06/04 0549) Resp:  [14-23] 18 (06/04 0549) BP: (110-161)/(77-91) 130/88 (06/04 0549) SpO2:  [100 %] 100 % (06/04 0549) Weight change:  Last BM Date : 03/28/22  PE: Alert, awake, oriented x3 GENERAL: Not in distress, Mild pallor ABDOMEN: Soft, nondistended, nontender EXTREMITIES: No deformity  Lab Results: Results for orders placed or performed during the hospital encounter of 03/26/22 (from the past 48 hour(s))  Hemoglobin and hematocrit, blood     Status: Abnormal   Collection Time: 03/27/22  1:48 PM  Result Value Ref Range   Hemoglobin 10.5 (L) 12.0 - 15.0 g/dL    Comment: REPEATED TO VERIFY POST TRANSFUSION SPECIMEN    HCT 30.7 (L) 36.0 - 46.0 %    Comment: Performed at [facility], [address], [place], [place] [address]  CBC     Status: Abnormal   Collection Time: 03/27/22  6:03 PM  Result Value Ref Range   WBC 6.6 4.0 - 10.5 K/uL   RBC 3.19 (L) 3.87 - 5.11 MIL/uL   Hemoglobin 9.7 (L) 12.0 - 15.0 g/dL   HCT 16.3 (L) 84.6 - 65.9 %   MCV 88.1 80.0 - 100.0 fL   MCH 30.4 26.0 - 34.0 pg   MCHC 34.5 30.0 - 36.0 g/dL   RDW 93.5 (H) 70.1 - 77.9 %   Platelets 146 (L) 150 - 400 K/uL   nRBC 0.3 (H) 0.0 - 0.2 %    Comment: Performed at [facility], [address], [place], [place] [address]  Vitamin B12     Status: Abnormal   Collection Time: 03/28/22  3:00 AM  Result Value Ref Range   Vitamin B-12 168 (L) 180 - 914 pg/mL    Comment: (NOTE) This assay is not validated for testing neonatal or myeloproliferative syndrome specimens for Vitamin B12 levels. Performed at [facility], [address] [place], [place] [address]   Folate     Status: None   Collection Time:  03/28/22  3:00 AM  Result Value Ref Range   Folate 8.0 >5.9 ng/mL    Comment: Performed at [facility], [address] [place], [place] [address]  Iron and TIBC     Status: Abnormal   Collection Time: 03/28/22  3:00 AM  Result Value Ref Range   Iron 21 (L) 28 - 170 ug/dL   TIBC 263 (L) 335 - 456 ug/dL   Saturation Ratios 11 10.4 - 31.8 %   UIBC 178 ug/dL    Comment: Performed at [facility], [address], [place], [place] [address]  Ferritin     Status: None   Collection Time: 03/28/22  3:00 AM  Result Value Ref Range   Ferritin 163 11 - 307 ng/mL    Comment: Performed at [facility], [address], [place], [place] [address]  Reticulocytes     Status: Abnormal   Collection Time: 03/28/22  3:00 AM  Result Value Ref Range   Retic Ct Pct 1.1 0.4 - 3.1 %   RBC. 2.87 (L) 3.87 - 5.11 MIL/uL   Retic Count, Absolute 31.9 19.0 - 186.0 K/uL   Immature Retic Fract 35.7 (H) 2.3 - 15.9 %    Comment: Performed at  [facility], [address] [place], [place] [address]  CBC     Status: Abnormal   Collection Time: 03/28/22  3:00 AM  Result Value Ref Range   WBC 4.1 4.0 - 10.5 K/uL   RBC 3.04 (L) 3.87 - 5.11 MIL/uL   Hemoglobin 9.2 (L) 12.0 - 15.0 g/dL   HCT 91.426.8 (L) 78.236.0 - 95.646.0 %   MCV 88.2 80.0 - 100.0 fL   MCH 30.3 26.0 - 34.0 pg   MCHC 34.3 30.0 - 36.0 g/dL   RDW 21.320.2 (H) 08.611.5 - 57.815.5 %   Platelets 124 (L) 150 - 400 K/uL   nRBC 0.5 (H) 0.0 - 0.2 %    Comment: Performed at [facility], [address] [place], [place] [address]  Basic metabolic panel     Status: Abnormal   Collection Time: 03/28/22  3:00 AM  Result Value Ref Range   Sodium 137 135 - 145 mmol/L   Potassium 3.5 3.5 - 5.1 mmol/L   Chloride 108 98 - 111 mmol/L   CO2 23 22 - 32 mmol/L   Glucose, Bld 92 70 - 99 mg/dL    Comment: Glucose reference range applies only to samples taken after fasting for at least 8 hours.    BUN <5 (L) 6 - 20 mg/dL   Creatinine, Ser 9.520.55 0.44 - 1.00 mg/dL   Calcium 8.6 (L) 8.9 - 10.3 mg/dL   GFR, Estimated >84>60 >13>60 mL/min    Comment: (NOTE) Calculated using the CKD-EPI Creatinine Equation (2021)    Anion gap 6 5 - 15    Comment: Performed at [facility], [address] [place], [place] [address]  CBC     Status: Abnormal   Collection Time: 03/28/22  6:51 PM  Result Value Ref Range   WBC 7.2 4.0 - 10.5 K/uL   RBC 3.47 (L) 3.87 - 5.11 MIL/uL   Hemoglobin 10.4 (L) 12.0 - 15.0 g/dL   HCT 02.731.4 (L) 25.336.0 - 66.446.0 %   MCV 90.5 80.0 - 100.0 fL   MCH 30.0 26.0 - 34.0 pg   MCHC 33.1 30.0 - 36.0 g/dL   RDW 40.320.9 (H) 47.411.5 - 25.915.5 %   Platelets 135 (L) 150 - 400 K/uL   nRBC 0.4 (H) 0.0 - 0.2 %    Comment: Performed at [facility], [address] [place], [place] [address]  CBC     Status: Abnormal   Collection Time: 03/29/22  8:15 AM  Result Value Ref Range   WBC 4.0 4.0 - 10.5 K/uL   RBC 2.75 (L) 3.87 - 5.11 MIL/uL   Hemoglobin 8.3 (L) 12.0 - 15.0 g/dL   HCT 56.424.4 (L) 33.236.0 - 95.146.0 %   MCV 88.7 80.0 - 100.0 fL   MCH 30.2 26.0 - 34.0 pg   MCHC 34.0 30.0 - 36.0 g/dL   RDW 88.420.3 (H) 16.611.5 - 06.315.5 %   Platelets 126 (L) 150 - 400 K/uL   nRBC 0.5 (H) 0.0 - 0.2 %    Comment: Performed at [facility], [address] [place], [place] [address]    Studies/Results: No results found.  Medications: I have reviewed the patient's current medications.  Assessment: Gastric ulcers, endoclips placed, biopsies pending for H. Pylori Hemoglobin stable at 8.3, BUN less than 5, no further episodes of melena Ferritin 163 with iron saturation 11%, not compatible with iron deficiency with a low TIBC Low vitamin B12  Significant alcohol  use  Plan: Okay to DC home from GI standpoint Continue PPI twice daily for 2 months If biopsies show H. pylori, will send treatment accordingly as an outpatient. Advised patient to avoid alcohol, avoid  aspirin and NSAIDs. GI will sign off, please recall if needed.  Kerin Salen, MD 03/29/2022, 10:46 AM

## 2022-03-29 NOTE — Plan of Care (Signed)
  Problem: Education: Goal: Knowledge of General Education information will improve Description: Including pain rating scale, medication(s)/side effects and non-pharmacologic comfort measures Outcome: Adequate for Discharge   Problem: Health Behavior/Discharge Planning: Goal: Ability to manage health-related needs will improve Outcome: Adequate for Discharge   Problem: Clinical Measurements: Goal: Ability to maintain clinical measurements within normal limits will improve Outcome: Adequate for Discharge Goal: Will remain free from infection Outcome: Adequate for Discharge Goal: Diagnostic test results will improve Outcome: Adequate for Discharge Goal: Respiratory complications will improve Outcome: Adequate for Discharge Goal: Cardiovascular complication will be avoided Outcome: Adequate for Discharge   Problem: Activity: Goal: Risk for activity intolerance will decrease Outcome: Adequate for Discharge   Problem: Nutrition: Goal: Adequate nutrition will be maintained Outcome: Adequate for Discharge   Problem: Coping: Goal: Level of anxiety will decrease Outcome: Adequate for Discharge   Problem: Elimination: Goal: Will not experience complications related to bowel motility Outcome: Adequate for Discharge Goal: Will not experience complications related to urinary retention Outcome: Adequate for Discharge   Problem: Pain Managment: Goal: General experience of comfort will improve Outcome: Adequate for Discharge   Problem: Safety: Goal: Ability to remain free from injury will improve Outcome: Adequate for Discharge   Problem: Skin Integrity: Goal: Risk for impaired skin integrity will decrease Outcome: Adequate for Discharge   Problem: Education: Goal: Ability to identify signs and symptoms of gastrointestinal bleeding will improve Outcome: Adequate for Discharge   Problem: Bowel/Gastric: Goal: Will show no signs and symptoms of gastrointestinal bleeding Outcome:  Adequate for Discharge   Problem: Fluid Volume: Goal: Will show no signs and symptoms of excessive bleeding Outcome: Adequate for Discharge   Problem: Clinical Measurements: Goal: Complications related to the disease process, condition or treatment will be avoided or minimized Outcome: Adequate for Discharge   Problem: Education: Goal: Knowledge of disease or condition will improve Outcome: Adequate for Discharge Goal: Understanding of discharge needs will improve Outcome: Adequate for Discharge   Problem: Health Behavior/Discharge Planning: Goal: Ability to identify changes in lifestyle to reduce recurrence of condition will improve Outcome: Adequate for Discharge Goal: Identification of resources available to assist in meeting health care needs will improve Outcome: Adequate for Discharge   Problem: Physical Regulation: Goal: Complications related to the disease process, condition or treatment will be avoided or minimized Outcome: Adequate for Discharge   Problem: Safety: Goal: Ability to remain free from injury will improve Outcome: Adequate for Discharge

## 2022-03-29 NOTE — Progress Notes (Signed)
Assessment unchanged. Pt and husband verbalized understanding of dc instructions through teach back including medications and follow up care. Discharged via wc to front entrance w/ NT and husband.

## 2022-03-30 ENCOUNTER — Encounter (HOSPITAL_COMMUNITY): Payer: Self-pay | Admitting: Gastroenterology

## 2022-03-31 LAB — SURGICAL PATHOLOGY

## 2023-07-05 IMAGING — CT CT HEAD W/O CM
3 series · 16 of 47 positions shown, 19 images · non-contrast
Comparison: None.

CLINICAL DATA: Headache.



[Series 2: head wo · axial · 0.39mm/px · z∈[-246,-106]mm · 10 of 34 slices shown, 13 images]
[im 3/34  brain]
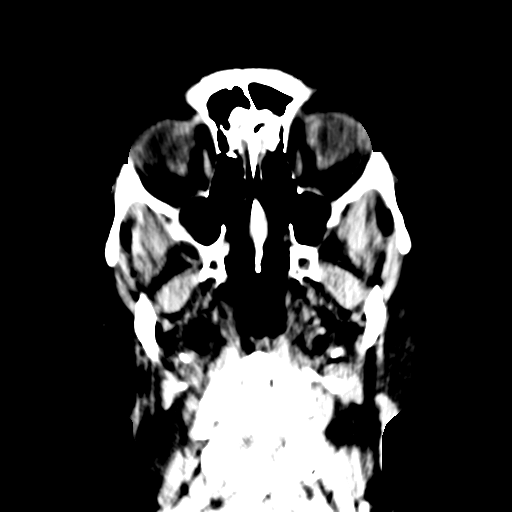
[im 3/34  bone]
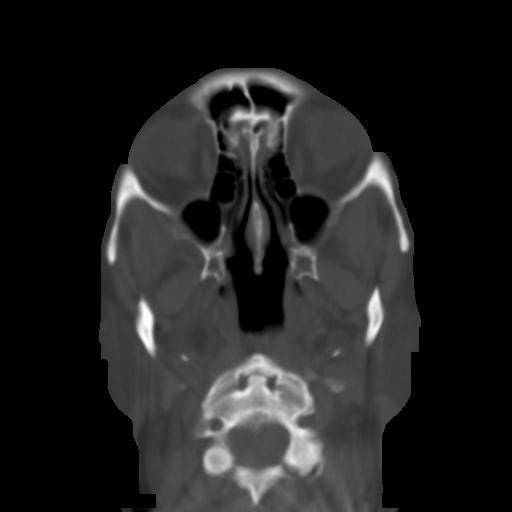
[im 6/34  brain]
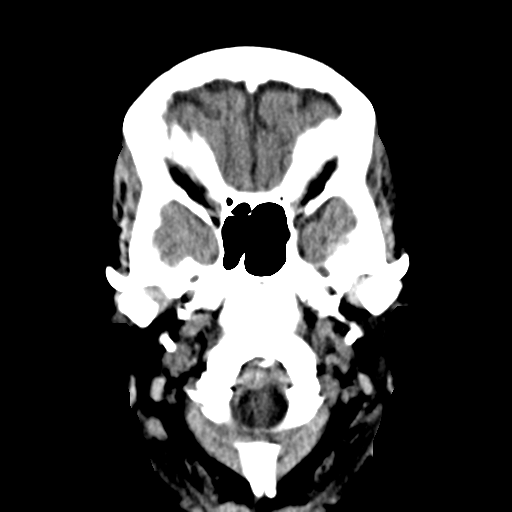
[im 10/34  brain]
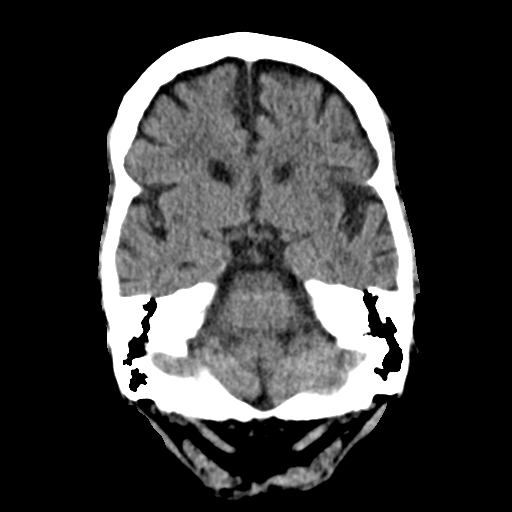
[im 12/34  brain]
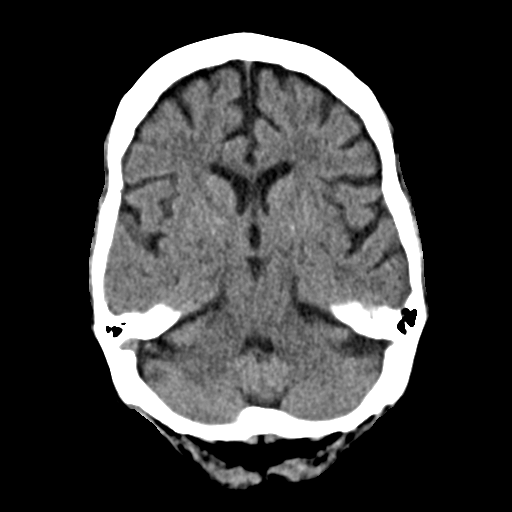
[im 15/34  brain]
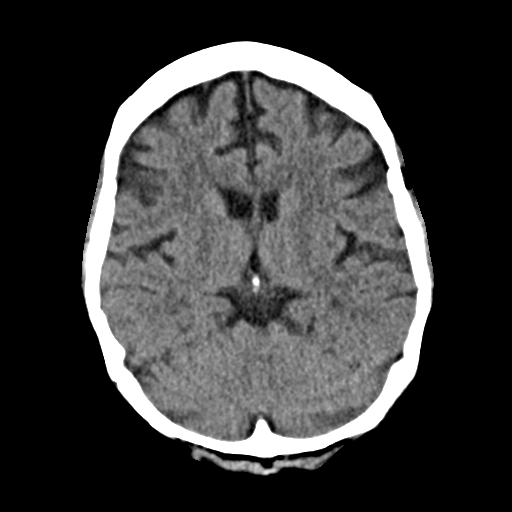
[im 15/34  bone]
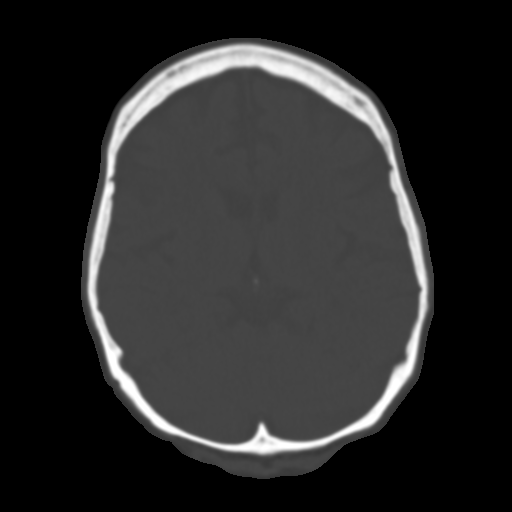
[im 19/34  brain]
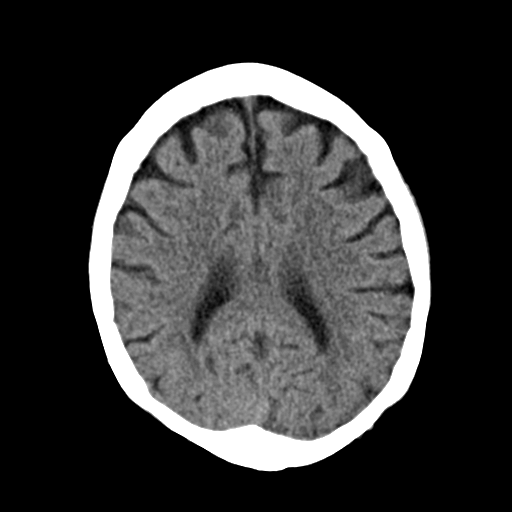
[im 22/34  brain]
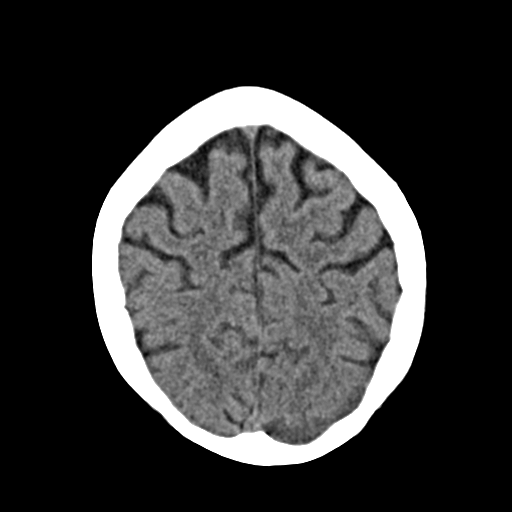
[im 26/34  brain]
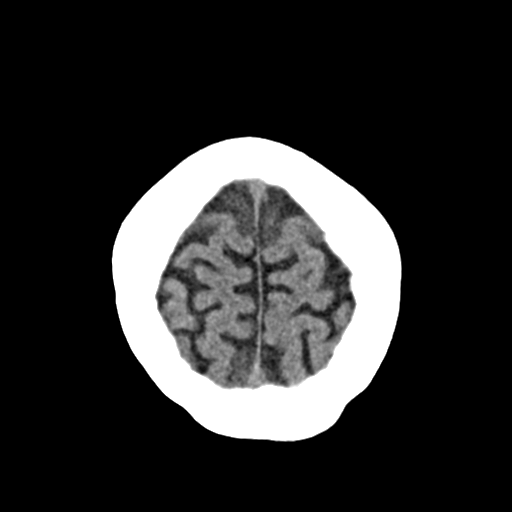
[im 28/34  brain]
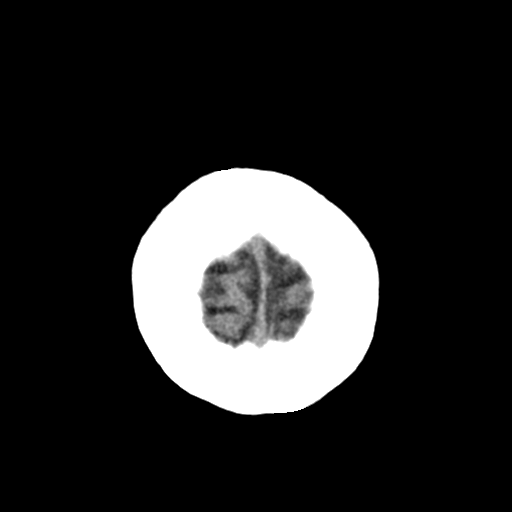
[im 28/34  bone]
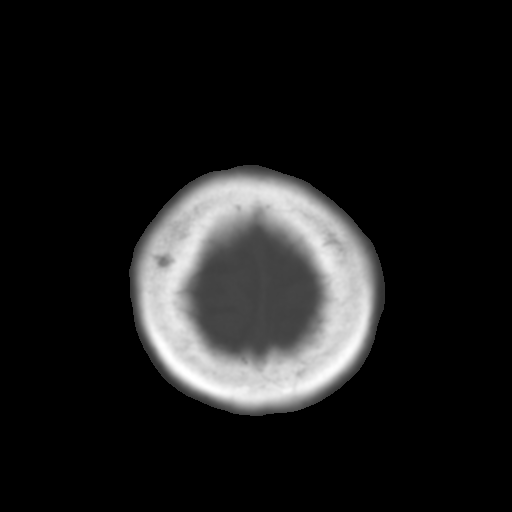
[im 31/34  brain]
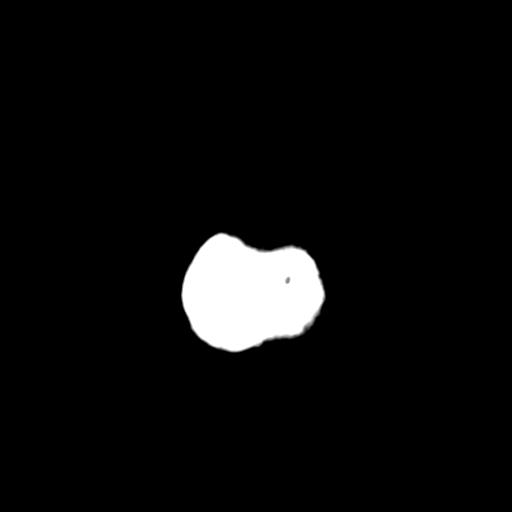

[Series 4: coronal soft · coronal · 0.30mm/px · 3 of 64 slices shown]
[im 22/64  brain]
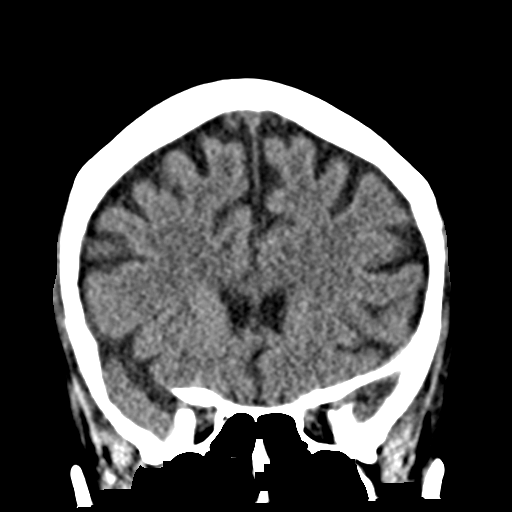
[im 29/64  brain]
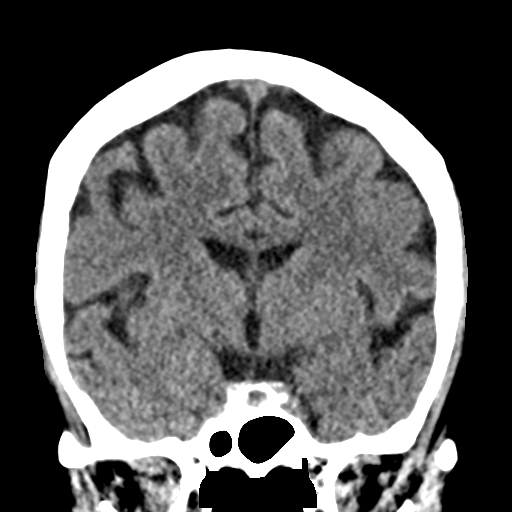
[im 36/64  brain]
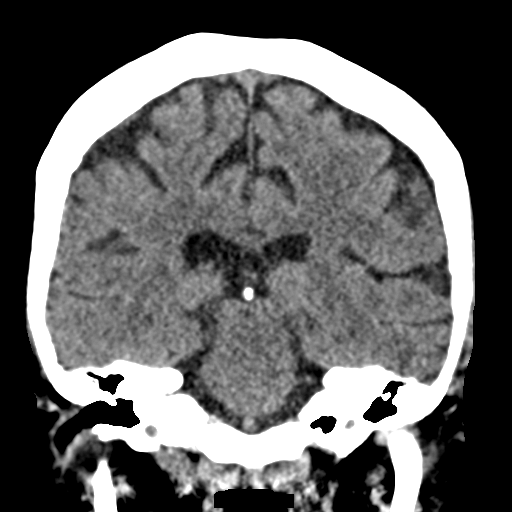

[Series 5: sag soft · sagittal · 0.31mm/px · 3 of 50 slices shown]
[im 17/50  brain]
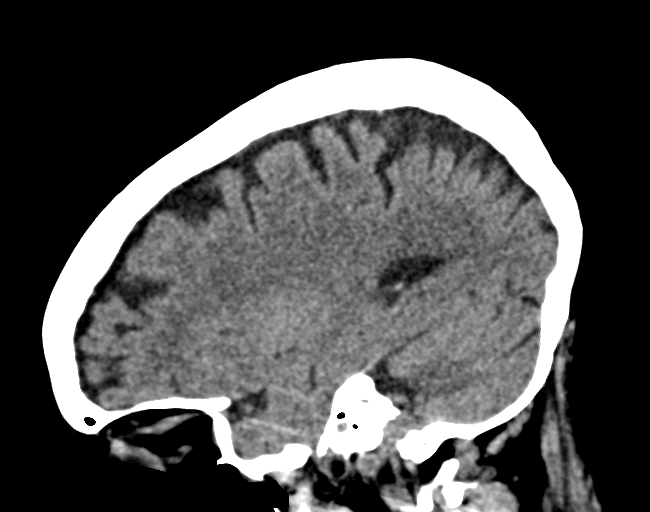
[im 25/50  brain]
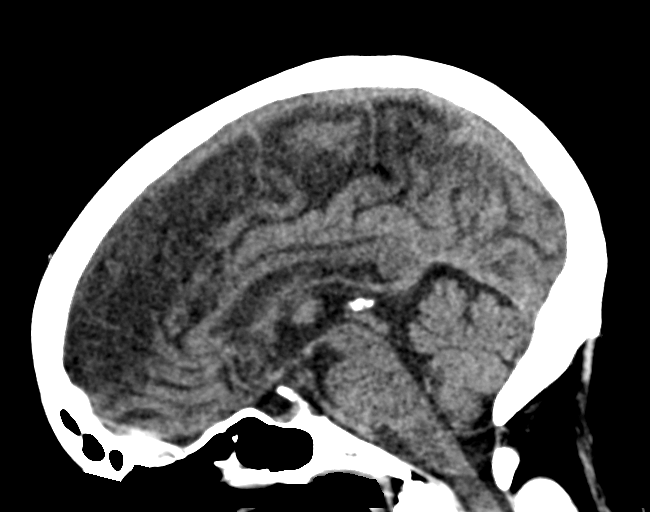
[im 33/50  brain]
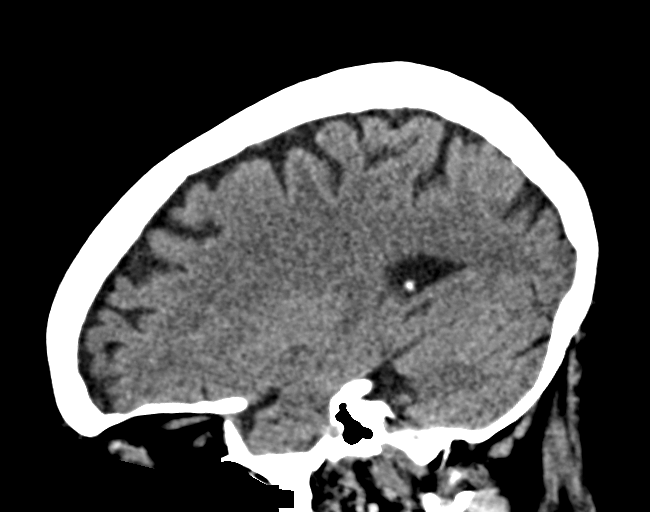

[16 of 47 positions shown; findings below may reference images not displayed]

FINDINGS: Brain: Mild diffuse cortical atrophy. The gray-white matter
discrimination is preserved. There is no acute intracranial
hemorrhage. No mass effect or midline shift. No extra-axial fluid
collection.

Vascular: No hyperdense vessel or unexpected calcification.

Skull: Normal. Negative for fracture or focal lesion.

Sinuses/Orbits: No acute finding.

Other: None
IMPRESSION: No acute intracranial pathology.

## 2023-07-14 ENCOUNTER — Emergency Department (HOSPITAL_BASED_OUTPATIENT_CLINIC_OR_DEPARTMENT_OTHER)
Admission: EM | Admit: 2023-07-14 | Discharge: 2023-07-14 | Disposition: A | Discharge status: Home / Self Care | Payer: 59 | Attending: Emergency Medicine | Admitting: Emergency Medicine

## 2023-07-14 ENCOUNTER — Encounter (HOSPITAL_BASED_OUTPATIENT_CLINIC_OR_DEPARTMENT_OTHER): Payer: Self-pay | Admitting: Emergency Medicine

## 2023-07-14 ENCOUNTER — Other Ambulatory Visit: Payer: Self-pay

## 2023-07-14 ENCOUNTER — Emergency Department (HOSPITAL_BASED_OUTPATIENT_CLINIC_OR_DEPARTMENT_OTHER): Payer: 59

## 2023-07-14 DIAGNOSIS — Z79899 Other long term (current) drug therapy: Secondary | ICD-10-CM | POA: Insufficient documentation

## 2023-07-14 DIAGNOSIS — U071 COVID-19: Secondary | ICD-10-CM | POA: Insufficient documentation

## 2023-07-14 DIAGNOSIS — I10 Essential (primary) hypertension: Secondary | ICD-10-CM | POA: Diagnosis not present

## 2023-07-14 DIAGNOSIS — R059 Cough, unspecified: Secondary | ICD-10-CM | POA: Diagnosis present

## 2023-07-14 LAB — SARS CORONAVIRUS 2 BY RT PCR: SARS Coronavirus 2 by RT PCR: POSITIVE — AB

## 2023-07-14 MED ORDER — DEXTROMETHORPHAN HBR 15 MG/5ML PO SYRP: 10.0000 mL | ORAL_SOLUTION | Freq: Four times a day (QID) | ORAL | 0 refills | Status: AC | PRN

## 2023-07-14 NOTE — Discharge Instructions (Signed)
Thank you for coming to [facility] Emergency Department. HAPPY BIRTHDAY!!   You were seen for cough and runny nose. We did an exam, labs, and imaging, and these showed covid-19 Please follow up with your primary care provider within 1 week if your symptoms worsen or do not improve. You requested a cough suppressant. We have prescribed dextromethorphan syrup, you can take 10 mL every 6 hours for cough. Please do not drive while taking this medication. Please also use over the counter suppressants such as mucinex and tea with honey or cough drops.   Do not hesitate to return to the ED or call 911 if you experience: -Worsening symptoms -Shortness of breath -Chest pain -Lightheadedness, passing out -Fevers/chills -Anything else that concerns you

## 2023-07-14 NOTE — ED Provider Notes (Signed)
[facility] Provider Note   CSN: [id number] Arrival date & time: 07/14/23  3474     History  Chief Complaint  Patient presents with   Cough   Nasal Congestion   sneezing    Alice Riley is a 62 y.o. female with PMH as listed below who presents with cough, runny nose, sneezing, and ear congestion. Pt reports was around a friend this weekend who then tested positive for Covid. Patient came to day because she just wants to make sure she does not have it as she has family coming to her house today to celebrate her birthday. She denies sore throat, SOB, CP, abd pain, N/V/D/C, fevers/chills. Had coughing last night which kept her awake all night. Coughing up yellow/green mucous. Tried tea/honey, mucinex for cough which didn't help.  Past Medical History:  Diagnosis Date   Hypertension    Peripheral neuropathy    Scoliosis        Home Medications Prior to Admission medications   Medication Sig Start Date End Date Taking? Authorizing Provider  dextromethorphan 15 MG/5ML syrup Take 10 mLs (30 mg total) by mouth 4 (four) times daily as needed for up to 7 days for cough. 07/14/23 07/21/23 Yes Loetta Rough, MD  acetaminophen (TYLENOL) 500 MG tablet Take 500 mg by mouth every 6 (six) hours as needed for moderate pain.    [provider]  amLODipine (NORVASC) 5 MG tablet Take 5 mg by mouth daily. 04/01/20   [provider]  atorvastatin (LIPITOR) 80 MG tablet Take 80 mg by mouth daily. 09/26/18   [provider]  ferrous sulfate 325 (65 FE) MG tablet Take 1 tablet (325 mg total) by mouth daily. 04/04/22 04/04/23  Regalado, Jon Billings A, MD  ketorolac (ACULAR) 0.5 % ophthalmic solution Place 1 drop into the left eye 4 (four) times daily as needed (eye irritation). 03/10/22   [provider]  lisinopril (ZESTRIL) 10 MG tablet Take 10 mg by mouth daily. 05/30/20   [provider]  pantoprazole (PROTONIX) 40 MG tablet  Take 1 tablet (40 mg total) by mouth 2 (two) times daily. 03/29/22   Regalado, Belkys A, MD  pregabalin (LYRICA) 100 MG capsule Take 100 mg by mouth 2 (two) times daily. 11/11/18   [provider]  thiamine 100 MG tablet Take 1 tablet (100 mg total) by mouth daily. 03/30/22   Regalado, Belkys A, MD  vitamin B-12 (CYANOCOBALAMIN) 1000 MCG tablet Take 1 tablet (1,000 mcg total) by mouth daily. 03/29/22   Regalado, Jon Billings A, MD      Allergies    Shellfish allergy    Review of Systems   Review of Systems A 10 point review of systems was performed and is negative unless otherwise reported in HPI.  Physical Exam Updated Vital Signs BP (!) 145/92   Pulse 79   Temp 98.6 F (37 C)   Resp 18   Ht 5\' 6"  (1.676 m)   Wt 63.5 kg   LMP 10/01/2009   SpO2 100%   BMI 22.60 kg/m  Physical Exam General: Normal appearing female, lying in bed.  HEENT: PERRLA, Sclera anicteric, MMM, trachea midline.  Cardiology: RRR, no murmurs/rubs/gallops. BL radial and DP pulses equal bilaterally.  Resp: Normal respiratory rate and effort. CTAB, no wheezes, rhonchi, crackles.  Abd: Soft, non-tender, non-distended. No rebound tenderness or guarding.  GU: Deferred. MSK: No peripheral edema or signs of trauma. Extremities without deformity or TTP. No cyanosis or  clubbing. Skin: warm, dry. No rashes or lesions. Back: No CVA tenderness Neuro: A&Ox4, CNs II-XII grossly intact. MAEs. Sensation grossly intact.  Psych: Normal mood and affect.   ED Results / Procedures / Treatments   Labs (all labs ordered are listed, but only abnormal results are displayed) Labs Reviewed  SARS CORONAVIRUS 2 BY RT PCR - Abnormal; Notable for the following components:      Result Value   SARS Coronavirus 2 by RT PCR POSITIVE (*)    All other components within normal limits    EKG None  Radiology DG Chest 2 View  Result Date: 07/14/2023 CLINICAL DATA:  productive cough EXAM: CHEST - 2 VIEW COMPARISON:  None Available.  FINDINGS: No pleural effusion. No pneumothorax. Rightward curvature of the thoracic spine with a Harrington rod in place. Normal cardiac and mediastinal contours. No focal airspace opacity. No radiographically apparent displaced rib fractures. Visualized upper abdomen is notable for surgical clips in the right upper quadrant. Vertebral body heights are maintained. IMPRESSION: No focal airspace opacity Electronically Signed   By: Lorenza Cambridge M.D.   On: 07/14/2023 08:31    Procedures Procedures    Medications Ordered in ED Medications - No data to display  ED Course/ Medical Decision Making/ A&P                          Medical Decision Making Amount and/or Complexity of Data Reviewed Labs:  Decision-making details documented in ED Course. Radiology: ordered. Decision-making details documented in ED Course.  Risk OTC drugs.    This patient presents to the ED for concern of cough/flu-like symptoms, this involves an extensive number of treatment options, and is a complaint that carries with it a high risk of complications and morbidity.  I considered the following differential and admission for this acute, potentially life threatening condition.   MDM:    This well-appearing patient presents with cough/congestion, likely secondary to viral syndrome vs covid/flu vs bronchitis. Will perform viral testing and CXR to r/o pneumonia. Low suspicion for serious bacterial infection such as sepsis  given nontoxic appearance, and the patient is SORA with no resp distress.  Clinical Course as of 07/14/23 1024  Wed Jul 14, 2023  0813 SARS Coronavirus 2 by RT PCR(!): POSITIVE Covid + [HN]  7829 DG Chest 2 View Neg for PNA [HN]    Clinical Course User Index [HN] Loetta Rough, MD    Labs: I Ordered, and personally interpreted labs.  The pertinent results include:  positive covid  Imaging Studies ordered: I ordered imaging studies including CXR I independently visualized and interpreted  imaging. I agree with the radiologist interpretation  Additional history obtained from chart review.    Reevaluation: After the interventions noted above, I reevaluated the patient and found that they have :stayed the same  Social Determinants of Health: Lives independently  Disposition:  Patient with positive covid test. She has no resp distress, no findings on CXR and no PNA. She is very stable on RA, overall well-appearing. She requested a cough suppressant so is given rx for dextromethorphan, instructed to take care and not drive while using this medication. Advised to continue mucinex and tea/honey. Advised to f/u with PCP. DC w/ discharge instructions/return precautions. All questions answered to patient's satisfaction.    Co morbidities that complicate the patient evaluation  Past Medical History:  Diagnosis Date   Hypertension    Peripheral neuropathy    Scoliosis  Medicines Meds ordered this encounter  Medications   dextromethorphan 15 MG/5ML syrup    Sig: Take 10 mLs (30 mg total) by mouth 4 (four) times daily as needed for up to 7 days for cough.    Dispense:  120 mL    Refill:  0    I have reviewed the patients home medicines and have made adjustments as needed  Problem List / ED Course: Problem List Items Addressed This Visit   None Visit Diagnoses     COVID-19    -  Primary                   This note was created using dictation software, which may contain spelling or grammatical errors.    Loetta Rough, MD 07/14/23 1024

## 2023-07-14 NOTE — ED Triage Notes (Signed)
PT ambulatory to triage without difficulty with c/o cough, runny nose, sneezing, and ear congestion.  Pt reports was around a friend this weekend who then tested positive for Covid.  Pt wants to make sure she does not have it as she has family coming to her house today.
